# Patient Record
Sex: Female | Born: 1950
Health system: Southern US, Community
[De-identification: ages and names within clinical notes are randomized; demographics above are authoritative.]

## PROBLEM LIST (undated history)

## (undated) DIAGNOSIS — T7840XA Allergy, unspecified, initial encounter: Secondary | ICD-10-CM

## (undated) DIAGNOSIS — I10 Essential (primary) hypertension: Secondary | ICD-10-CM

## (undated) HISTORY — PX: ABDOMINAL HYSTERECTOMY: SHX81

## (undated) HISTORY — DX: Allergy, unspecified, initial encounter: T78.40XA

## (undated) HISTORY — DX: Essential (primary) hypertension: I10

---

## 2004-05-03 ENCOUNTER — Ambulatory Visit: Payer: Self-pay | Admitting: General Practice

## 2004-05-08 ENCOUNTER — Ambulatory Visit: Payer: Self-pay | Admitting: General Practice

## 2005-05-01 ENCOUNTER — Ambulatory Visit: Payer: Self-pay | Admitting: General Practice

## 2006-05-07 ENCOUNTER — Ambulatory Visit: Payer: Self-pay | Admitting: General Practice

## 2006-07-01 ENCOUNTER — Ambulatory Visit: Payer: Self-pay | Admitting: Unknown Physician Specialty

## 2006-12-03 ENCOUNTER — Ambulatory Visit: Payer: Self-pay | Admitting: Unknown Physician Specialty

## 2007-05-06 ENCOUNTER — Ambulatory Visit: Payer: Self-pay | Admitting: General Practice

## 2008-05-25 ENCOUNTER — Ambulatory Visit: Payer: Self-pay | Admitting: General Practice

## 2009-05-31 ENCOUNTER — Ambulatory Visit: Payer: Self-pay | Admitting: Unknown Physician Specialty

## 2010-06-07 ENCOUNTER — Ambulatory Visit: Payer: Self-pay | Admitting: Unknown Physician Specialty

## 2011-06-13 ENCOUNTER — Ambulatory Visit: Payer: Self-pay | Admitting: General Practice

## 2012-07-21 ENCOUNTER — Ambulatory Visit: Payer: Self-pay | Admitting: Family Medicine

## 2012-08-05 ENCOUNTER — Ambulatory Visit: Payer: Self-pay | Admitting: General Practice

## 2012-10-01 ENCOUNTER — Ambulatory Visit: Payer: Self-pay | Admitting: Family Medicine

## 2013-06-30 ENCOUNTER — Ambulatory Visit: Payer: Self-pay | Admitting: Family Medicine

## 2014-06-29 ENCOUNTER — Ambulatory Visit: Payer: Self-pay | Admitting: General Practice

## 2015-06-08 ENCOUNTER — Other Ambulatory Visit: Payer: Self-pay | Admitting: Family Medicine

## 2015-06-08 DIAGNOSIS — Z1231 Encounter for screening mammogram for malignant neoplasm of breast: Secondary | ICD-10-CM

## 2015-07-05 ENCOUNTER — Ambulatory Visit
Admission: RE | Admit: 2015-07-05 | Discharge: 2015-07-05 | Disposition: A | Discharge status: Home / Self Care | Payer: BLUE CROSS/BLUE SHIELD | Source: Ambulatory Visit | Attending: Family Medicine | Admitting: Family Medicine

## 2015-07-05 DIAGNOSIS — Z1231 Encounter for screening mammogram for malignant neoplasm of breast: Secondary | ICD-10-CM | POA: Insufficient documentation

## 2016-06-11 ENCOUNTER — Other Ambulatory Visit: Payer: Self-pay | Admitting: Family Medicine

## 2016-06-11 DIAGNOSIS — Z1231 Encounter for screening mammogram for malignant neoplasm of breast: Secondary | ICD-10-CM

## 2016-07-10 ENCOUNTER — Ambulatory Visit
Admission: RE | Admit: 2016-07-10 | Discharge: 2016-07-10 | Disposition: A | Discharge status: Home / Self Care | Payer: BLUE CROSS/BLUE SHIELD | Source: Ambulatory Visit | Attending: Family Medicine | Admitting: Family Medicine

## 2016-07-10 DIAGNOSIS — Z1231 Encounter for screening mammogram for malignant neoplasm of breast: Secondary | ICD-10-CM | POA: Insufficient documentation

## 2016-09-10 ENCOUNTER — Other Ambulatory Visit: Payer: Self-pay | Admitting: Family Medicine

## 2016-09-10 DIAGNOSIS — E28319 Asymptomatic premature menopause: Secondary | ICD-10-CM

## 2016-09-10 DIAGNOSIS — Z13820 Encounter for screening for osteoporosis: Secondary | ICD-10-CM

## 2016-09-20 ENCOUNTER — Ambulatory Visit
Admission: RE | Admit: 2016-09-20 | Discharge: 2016-09-20 | Disposition: A | Discharge status: Home / Self Care | Payer: BLUE CROSS/BLUE SHIELD | Source: Ambulatory Visit | Attending: Family Medicine | Admitting: Family Medicine

## 2016-09-20 DIAGNOSIS — Z13820 Encounter for screening for osteoporosis: Secondary | ICD-10-CM

## 2016-09-20 DIAGNOSIS — E28319 Asymptomatic premature menopause: Secondary | ICD-10-CM | POA: Diagnosis not present

## 2017-05-10 ENCOUNTER — Ambulatory Visit (INDEPENDENT_AMBULATORY_CARE_PROVIDER_SITE_OTHER): Payer: Medicare Other | Admitting: Physician Assistant

## 2017-05-10 ENCOUNTER — Encounter: Payer: Self-pay | Admitting: Physician Assistant

## 2017-05-10 ENCOUNTER — Other Ambulatory Visit: Payer: Self-pay

## 2017-05-10 VITALS — BP 140/90 | HR 85 | Temp 98.5°F | Resp 16 | Ht 72.0 in | Wt 224.6 lb

## 2017-05-10 DIAGNOSIS — I890 Lymphedema, not elsewhere classified: Secondary | ICD-10-CM

## 2017-05-10 DIAGNOSIS — Z7689 Persons encountering health services in other specified circumstances: Secondary | ICD-10-CM | POA: Diagnosis not present

## 2017-05-10 DIAGNOSIS — Z6830 Body mass index (BMI) 30.0-30.9, adult: Secondary | ICD-10-CM | POA: Diagnosis not present

## 2017-05-10 DIAGNOSIS — I10 Essential (primary) hypertension: Secondary | ICD-10-CM | POA: Diagnosis not present

## 2017-05-10 NOTE — Progress Notes (Signed)
Patient: Darlene Alvarez Female    DOB: Apr 14, 1950   67 y.o.   MRN: [id number] Visit Date: 05/10/2017  Today's Provider: Margaretann LovelessJennifer M Caydan Mctavish, PA-C   Chief Complaint  Patient presents with  . Establish Care   Subjective:    HPI Patient coming in today to establish care. She was previously a patient at [facility] with Dr. Dorothey BasemanStrickland and most recently Dr. Ellin Goodieabinowitz. She turned 67 yr old in December 2018 and they are no longer covered at the [place] after 66. She retired in 2010. She has had a colonoscopy in 2018 with Dr. Mechele CollinElliott. She gets regular mammograms, next due in 07/2017. She is up to date on vaccinations.   She reports she has history of hypertension and was treated up until 2010. She made lifestyle modifications and was able to discontinue medications. 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 He was diagnosed with stage 4 lung cancer and has been undergoing treatments. She reports he is doing well and the tumor has been responding to treatments. He is currently on immunotherapy only at this time, having completed current chemo and RXT. Since she has not been doing as well as she was with her dieting and exercise. She reports she has been snacking more due to stress. BP is slightly elevated today in the office.   She also has lymphedema of her legs bilaterally. She reports it is hereditary as it has been an issue since her teens and many women in her family have the same issue. She currently uses HCTZ 25mg  prn for when she is more "swollen". She does wear compression stockings and elevate her legs when at rest.      Allergies  Allergen Reactions  . Oxycodone-Acetaminophen Hives  . Sulfa Antibiotics Hives     Current Outpatient Medications:  .  hydrochlorothiazide (HYDRODIURIL) 25 MG tablet, Take by mouth., Disp: , Rfl:   Review of Systems  Constitutional: Negative.   HENT: Negative.   Eyes:  Negative.   Respiratory: Negative.   Cardiovascular: Positive for leg swelling.  Endocrine: Negative.   Genitourinary: Positive for vaginal discharge.  Musculoskeletal: Negative.   Skin: Positive for color change.  Allergic/Immunologic: Negative.   Neurological: Negative.   Hematological: Negative.   Psychiatric/Behavioral: Negative.     Social History   Tobacco Use  . Smoking status: Never Smoker  . Smokeless tobacco: Never Used  Substance Use Topics  . Alcohol use: No    Frequency: Never   Objective:   BP 140/90 (BP Location: Left Arm, Patient Position: Sitting, Cuff Size: Normal)   Pulse 85   Temp 98.5 F (36.9 C) (Oral)   Resp 16   Ht 6' (1.829 m)   Wt 224 lb 9.6 oz (101.9 kg)   BMI 30.46 kg/m    Physical Exam  Constitutional: She is oriented to person, place, and time. She appears well-developed and well-nourished. No distress.  HENT:  Head: Normocephalic and atraumatic.  Right Ear: Hearing, tympanic membrane, external ear and ear canal normal.  Left Ear: Hearing, tympanic membrane, external ear and ear canal normal.  Nose: Nose normal.  Mouth/Throat: Oropharynx is clear and moist. No oropharyngeal exudate.  Eyes: Conjunctivae and EOM are normal. Pupils are equal, round, and reactive to light. Right eye exhibits no discharge. Left eye exhibits no discharge. No scleral icterus.  Neck: Normal range of motion. Neck supple. No JVD present. No tracheal  deviation present. No thyromegaly present.  Cardiovascular: Normal rate, regular rhythm, normal heart sounds and intact distal pulses. Exam reveals no gallop and no friction rub.  No murmur heard. Pulmonary/Chest: Effort normal and breath sounds normal. No respiratory distress. She has no wheezes. She has no rales. She exhibits no tenderness.  Abdominal: Soft. Bowel sounds are normal. She exhibits no distension and no mass. There is no tenderness. There is no rebound and no guarding.  Musculoskeletal: Normal range of  motion. She exhibits no edema or tenderness.  Lymphadenopathy:    She has no cervical adenopathy.  Neurological: She is alert and oriented to person, place, and time.  Skin: Skin is warm and dry. No rash noted. She is not diaphoretic.  Psychiatric: She has a normal mood and affect. Her behavior is normal. Judgment and thought content normal.  Vitals reviewed.       Assessment & Plan:     1. Establishing care with new doctor, encounter for Establishing from the Occupational Health office at [facility].   2. Essential hypertension Slight elevation today. I will see her back at the end of March/early April for her initial welcome to medicare visit. If still elevated will discuss different options at that time.  3. Lymphedema Stable with conservative measures currently of compression stockings, elevating, and using HCTZ 25mg  prn.  4. BMI 30.0-30.9,adult Counseled patient on healthy lifestyle modifications including dieting and exercise.        Margaretann Loveless, PA-C  [facility]

## 2017-05-12 ENCOUNTER — Encounter: Payer: Self-pay | Admitting: Physician Assistant

## 2017-05-12 DIAGNOSIS — Z6830 Body mass index (BMI) 30.0-30.9, adult: Secondary | ICD-10-CM | POA: Insufficient documentation

## 2017-05-12 DIAGNOSIS — I890 Lymphedema, not elsewhere classified: Secondary | ICD-10-CM | POA: Insufficient documentation

## 2017-05-12 DIAGNOSIS — I10 Essential (primary) hypertension: Secondary | ICD-10-CM | POA: Insufficient documentation

## 2017-05-30 ENCOUNTER — Telehealth: Payer: Self-pay

## 2017-05-30 DIAGNOSIS — I10 Essential (primary) hypertension: Secondary | ICD-10-CM

## 2017-05-30 MED ORDER — AMLODIPINE BESYLATE 5 MG PO TABS: 5.0000 mg | ORAL_TABLET | Freq: Every day | ORAL | 1 refills | Status: DC

## 2017-05-30 NOTE — Telephone Encounter (Signed)
Patient is calling that her BP reading this morning was195/105. Reports that last night she took her BP just to check and it was 187/95. She reports that the only medicine that she takes is the Hydrochlorothiazide 25mg . Reports that it has been like a month or two that she has not been taking it until today.She does have a slight headache but feels is from the medication because it makes her feel like this every time she takes. She denies light headedness, SOB, visual disturbances, chest pain, palpitations. Reports that she feels fine. Please Advise.  No more appointments available with you.

## 2017-05-30 NOTE — Telephone Encounter (Signed)
Advised patient as below.  

## 2017-05-30 NOTE — Telephone Encounter (Signed)
Patient reports that she just had her BP rechecked and it is 189/105. I advised her what Antony ContrasJenni recommended below, and she reports that she will start taking the HCTZ more consistently. Could you also send in the additional BP [facility] [place]? The patient's only symptom currently is a headache. She denies chest pain, shortness of breath, dizziness, numbness and tingling in her extremities, and upper back pain.

## 2017-05-30 NOTE — Telephone Encounter (Signed)
Have her check BP in 1-2 hours after taking medication. She needs to take medication every day to keep better control over BP. If it is still elevated this afternoon I will send in a second pill for her to take. If she has side effects as to why she does not take HCTZ daily I can change her medication as well.

## 2017-05-30 NOTE — Telephone Encounter (Signed)
[facility] Call with BP reading tomorrow. Go to ER if symptoms change or worsen.

## 2017-05-30 NOTE — Telephone Encounter (Signed)
[id number] 

## 2017-06-07 ENCOUNTER — Telehealth: Payer: Self-pay

## 2017-06-07 NOTE — Telephone Encounter (Signed)
Left Messages that we already have Darlene Alvarez's scheduled and to schedule her Initial medicare wellness like they had discuss. Around the middle of March from the 15th and on.  Thanks,  -Alashia Brownfield

## 2017-06-14 NOTE — Telephone Encounter (Signed)
Pt scheduled on 03/28

## 2017-06-24 ENCOUNTER — Other Ambulatory Visit: Payer: Self-pay | Admitting: Physician Assistant

## 2017-06-24 DIAGNOSIS — I10 Essential (primary) hypertension: Secondary | ICD-10-CM

## 2017-06-24 MED ORDER — AMLODIPINE BESYLATE 5 MG PO TABS: 5.0000 mg | ORAL_TABLET | Freq: Every day | ORAL | 1 refills | Status: DC

## 2017-06-24 NOTE — Telephone Encounter (Signed)
Please review. Thanks!  

## 2017-06-24 NOTE — Telephone Encounter (Signed)
[facility] for a 90-days supply for the following medication. Thanks CC ° °amLODipine (NORVASC) 5 MG tablet  ° °

## 2017-07-04 ENCOUNTER — Ambulatory Visit (INDEPENDENT_AMBULATORY_CARE_PROVIDER_SITE_OTHER): Payer: Medicare Other | Admitting: Physician Assistant

## 2017-07-04 ENCOUNTER — Encounter: Payer: Self-pay | Admitting: Physician Assistant

## 2017-07-04 VITALS — BP 130/80 | HR 74 | Temp 98.2°F | Resp 16 | Ht 72.0 in | Wt 227.0 lb

## 2017-07-04 DIAGNOSIS — Z1239 Encounter for other screening for malignant neoplasm of breast: Secondary | ICD-10-CM

## 2017-07-04 DIAGNOSIS — Z0000 Encounter for general adult medical examination without abnormal findings: Secondary | ICD-10-CM

## 2017-07-04 DIAGNOSIS — Z1211 Encounter for screening for malignant neoplasm of colon: Secondary | ICD-10-CM | POA: Diagnosis not present

## 2017-07-04 DIAGNOSIS — Z1272 Encounter for screening for malignant neoplasm of vagina: Secondary | ICD-10-CM | POA: Diagnosis not present

## 2017-07-04 DIAGNOSIS — Z23 Encounter for immunization: Secondary | ICD-10-CM | POA: Diagnosis not present

## 2017-07-04 DIAGNOSIS — Z1159 Encounter for screening for other viral diseases: Secondary | ICD-10-CM

## 2017-07-04 DIAGNOSIS — I890 Lymphedema, not elsewhere classified: Secondary | ICD-10-CM

## 2017-07-04 DIAGNOSIS — Z114 Encounter for screening for human immunodeficiency virus [HIV]: Secondary | ICD-10-CM | POA: Diagnosis not present

## 2017-07-04 DIAGNOSIS — Z1231 Encounter for screening mammogram for malignant neoplasm of breast: Secondary | ICD-10-CM | POA: Diagnosis not present

## 2017-07-04 DIAGNOSIS — I10 Essential (primary) hypertension: Secondary | ICD-10-CM

## 2017-07-04 DIAGNOSIS — Z6830 Body mass index (BMI) 30.0-30.9, adult: Secondary | ICD-10-CM

## 2017-07-04 NOTE — Progress Notes (Signed)
Patient: Darlene Alvarez, Female    DOB: 12-28-1950, 67 y.o.   MRN: [id number] Visit Date: 07/04/2017  Today's Provider: Margaretann Loveless, PA-C   Chief Complaint  Patient presents with  . Medicare Wellness   Subjective:    Annual wellness visit Darlene Alvarez is a 67 y.o. female. She feels well. She reports exercising walking some. She reports she is sleeping well.  07/10/16 Mammogram-BI-RADS 1 09/20/16 BMD-Normal -----------------------------------------------------------   Review of Systems  Constitutional: Negative.   HENT: Positive for congestion.   Eyes: Negative.   Respiratory: Negative.   Cardiovascular: Positive for leg swelling (chronic -lymphedema).  Gastrointestinal: Negative.   Endocrine: Negative.   Genitourinary: Negative.   Musculoskeletal: Negative.   Skin: Negative.   Allergic/Immunologic: Negative.   Neurological: Negative.   Hematological: Negative.   Psychiatric/Behavioral: Negative.     Social History   Socioeconomic History  . Marital status: Married    Spouse name: Not on file  . Number of children: Not on file  . Years of education: Not on file  . Highest education level: Not on file  Occupational History  . Occupation: social service    Comment: Programmer, applications.  Social Needs  . Financial resource strain: Not on file  . Food insecurity:    Worry: Not on file    Inability: Not on file  . Transportation needs:    Medical: Not on file    Non-medical: Not on file  Tobacco Use  . Smoking status: Never Smoker  . Smokeless tobacco: Never Used  Substance and Sexual Activity  . Alcohol use: No    Frequency: Never  . Drug use: No  . Sexual activity: Not on file  Lifestyle  . Physical activity:    Days per week: Not on file    Minutes per session: Not on file  . Stress: Not on file  Relationships  . Social connections:    Talks on phone: Not on file    Gets together: Not on file    Attends religious  service: Not on file    Active member of club or organization: Not on file    Attends meetings of clubs or organizations: Not on file    Relationship status: Not on file  . Intimate partner violence:    Fear of current or ex partner: Not on file    Emotionally abused: Not on file    Physically abused: Not on file    Forced sexual activity: Not on file  Other Topics Concern  . Not on file  Social History Narrative  . Not on file    Past Medical History:  Diagnosis Date  . Allergy      Patient Active Problem List   Diagnosis Date Noted  . Essential hypertension 05/12/2017  . Lymphedema 05/12/2017  . BMI 30.0-30.9,adult 05/12/2017    Past Surgical History:  Procedure Laterality Date  . ABDOMINAL HYSTERECTOMY    . CESAREAN SECTION      Her family history includes Alcohol abuse in her mother; Cancer in her father; Diabetes in her brother, mother, other, sister, and sister; Healthy in her daughter; Hypertension in her father; Lung disease in her father; Stroke in her other; Thyroid disease in her sister and sister.      Current Outpatient Medications:  .  amLODipine (NORVASC) 5 MG tablet, Take 1 tablet (5 mg total) by mouth daily., Disp: 90 tablet, Rfl: 1 .  hydrochlorothiazide (HYDRODIURIL) 25 MG  tablet, Take by mouth., Disp: , Rfl:   Patient Care Team: Margaretann LovelessBurnette, Diane Mochizuki M, PA-C as PCP - General (Family Medicine)     Objective:   Vitals: BP 130/80 (BP Location: Left Arm, Patient Position: Sitting, Cuff Size: Large)   Pulse 74   Temp 98.2 F (36.8 C) (Oral)   Resp 16   Ht 6' (1.829 m)   Wt 227 lb (103 kg)   SpO2 96%   BMI 30.79 kg/m   Physical Exam  Constitutional: She is oriented to person, place, and time. She appears well-developed and well-nourished. No distress.  HENT:  Head: Normocephalic and atraumatic.  Right Ear: Hearing, tympanic membrane, external ear and ear canal normal.  Left Ear: Hearing, tympanic membrane, external ear and ear canal normal.    Nose: Nose normal.  Mouth/Throat: Uvula is midline, oropharynx is clear and moist and mucous membranes are normal. No oropharyngeal exudate.  Eyes: Pupils are equal, round, and reactive to light. Conjunctivae and EOM are normal. Right eye exhibits no discharge. Left eye exhibits no discharge. No scleral icterus.  Neck: Normal range of motion. Neck supple. No JVD present. Carotid bruit is not present. No tracheal deviation present. No thyromegaly present.  Cardiovascular: Normal rate, regular rhythm, normal heart sounds and intact distal pulses. Exam reveals no gallop and no friction rub.  No murmur heard. Pulmonary/Chest: Effort normal and breath sounds normal. No respiratory distress. She has no wheezes. She has no rales. She exhibits no tenderness. Right breast exhibits no inverted nipple, no mass, no nipple discharge, no skin change and no tenderness. Left breast exhibits no inverted nipple, no mass, no nipple discharge, no skin change and no tenderness. No breast swelling, tenderness, discharge or bleeding. Breasts are symmetrical.  Abdominal: Soft. Bowel sounds are normal. She exhibits no distension and no mass. There is no tenderness. There is no rebound and no guarding. Hernia confirmed negative in the right inguinal area and confirmed negative in the left inguinal area.  Genitourinary: Rectum normal and vagina normal. No breast swelling, tenderness, discharge or bleeding. Pelvic exam was performed with patient supine. There is no rash, tenderness, lesion or injury on the right labia. There is no rash, tenderness, lesion or injury on the left labia. Right adnexum displays no mass, no tenderness and no fullness. Left adnexum displays no mass, no tenderness and no fullness. No erythema, tenderness or bleeding in the vagina. No signs of injury around the vagina. No vaginal discharge found.  Genitourinary Comments: Uterus and cervix surgically absent  Musculoskeletal: Normal range of motion. She  exhibits no edema or tenderness.  Lymphadenopathy:    She has no cervical adenopathy.       Right: No inguinal adenopathy present.       Left: No inguinal adenopathy present.  Neurological: She is alert and oriented to person, place, and time. She has normal reflexes. No cranial nerve deficit. Coordination normal.  Skin: Skin is warm and dry. No rash noted. She is not diaphoretic.  Psychiatric: She has a normal mood and affect. Her behavior is normal. Judgment and thought content normal.  Vitals reviewed.   Activities of Daily Living In your present state of health, do you have any difficulty performing the following activities: 07/04/2017 05/10/2017  Hearing? N N  Vision? N N  Difficulty concentrating or making decisions? N N  Walking or climbing stairs? N N  Dressing or bathing? N N  Doing errands, shopping? N N  Some recent data might be hidden  Fall Risk Assessment Fall Risk  07/04/2017 05/10/2017  Falls in the past year? No No     Depression Screen PHQ 2/9 Scores 07/04/2017 05/10/2017  PHQ - 2 Score 0 0  PHQ- 9 Score 0 -    Cognitive Testing - 6-CIT  Correct? Score   What year is it? yes 0 0 or 4  What month is it? yes 0 0 or 3  Memorize:    Floyde Parkins,  43,  [address]  [place],      What time is it? (within 1 hour) yes 0 0 or 3  Count backwards from 20 yes 0 0, 2, or 4  Name the months of the year yes 0 0, 2, or 4  Repeat name & address above no 2 0, 2, 4, 6, 8, or 10       TOTAL SCORE  2/28   Interpretation:  Normal  Normal (0-7) Abnormal (8-28)     Assessment & Plan:     Annual Wellness Visit  Reviewed patient's Family Medical History Reviewed and updated list of patient's medical providers Assessment of cognitive impairment was done Assessed patient's functional ability Established a written schedule for health screening services Health Risk Assessent Completed and Reviewed  Exercise Activities and Dietary recommendations Goals    None        There is no immunization history on file for this patient.  Health Maintenance  Topic Date Due  . Hepatitis C Screening  06/18/1950  . HIV Screening  04/05/1967  . TETANUS/TDAP  04/05/1971  . PAP SMEAR  04/04/1973  . COLONOSCOPY  04/04/2002  . PNA vac Low Risk Adult (1 of 2 - PCV13) 04/04/2017  . INFLUENZA VACCINE  07/07/2017 (Originally 11/07/2016)  . MAMMOGRAM  07/11/2018  . DEXA SCAN  Completed     Discussed health benefits of physical activity, and encouraged her to engage in regular exercise appropriate for her age and condition.    1. Welcome to Medicare preventive visit EKG shows NSR with left atrial  - EKG 12-Lead  2. Breast cancer screening Breast exam today was normal. There is no family history of breast cancer. She does perform regular self breast exams. Mammogram was ordered as below. Information for [facility] was given to patient so she may schedule her mammogram at her convenience. - MM Digital Screening; Future  3. Screening for vaginal cancer Pap collected today. Will send as below and f/u pending results. - Pap IG and HPV (high risk) DNA detection  4. Colon cancer screening Reports done last year. Records requested.   5. Essential hypertension Stable. Continue HCTZ 25mg  and amlodipine 5mg . Will check labs as below and f/u pending results. - CBC w/Diff/Platelet - Comprehensive Metabolic Panel (CMET) - TSH - Lipid Profile  6. BMI 30.0-30.9,adult Counseled patient on healthy lifestyle modifications including dieting and exercise.  - CBC w/Diff/Platelet - Comprehensive Metabolic Panel (CMET) - TSH - Lipid Profile  7. Lymphedema Stable.  - Comprehensive Metabolic Panel (CMET) - TSH - Lipid Profile  8. Need for hepatitis C screening test - Hepatitis C Antibody  9. Screening for HIV without presence of risk factors - HIV antibody (with reflex)  10. Need for pneumococcal vaccination Patient declines.    ------------------------------------------------------------------------------------------------------------    Margaretann Loveless, PA-C  [facility]

## 2017-07-04 NOTE — Patient Instructions (Signed)
Health Maintenance for Postmenopausal Women Menopause is a normal process in which your reproductive ability comes to an end. This process happens gradually over a span of months to years, usually between the ages of 72 and 45. Menopause is complete when you have missed 12 consecutive menstrual periods. It is important to talk with your health care provider about some of the most common conditions that affect postmenopausal women, such as heart disease, cancer, and bone loss (osteoporosis). Adopting a healthy lifestyle and getting preventive care can help to promote your health and wellness. Those actions can also lower your chances of developing some of these common conditions. What should I know about menopause? During menopause, you may experience a number of symptoms, such as:  Moderate-to-severe hot flashes.  Night sweats.  Decrease in sex drive.  Mood swings.  Headaches.  Tiredness.  Irritability.  Memory problems.  Insomnia.  Choosing to treat or not to treat menopausal changes is an individual decision that you make with your health care provider. What should I know about hormone replacement therapy and supplements? Hormone therapy products are effective for treating symptoms that are associated with menopause, such as hot flashes and night sweats. Hormone replacement carries certain risks, especially as you become older. If you are thinking about using estrogen or estrogen with progestin treatments, discuss the benefits and risks with your health care provider. What should I know about heart disease and stroke? Heart disease, heart attack, and stroke become more likely as you age. This may be due, in part, to the hormonal changes that your body experiences during menopause. These can affect how your body processes dietary fats, triglycerides, and cholesterol. Heart attack and stroke are both medical emergencies. There are many things that you can do to help prevent heart disease  and stroke:  Have your blood pressure checked at least every 1-2 years. High blood pressure causes heart disease and increases the risk of stroke.  If you are 76-17 years old, ask your health care provider if you should take aspirin to prevent a heart attack or a stroke.  Do not use any tobacco products, including cigarettes, chewing tobacco, or electronic cigarettes. If you need help quitting, ask your health care provider.  It is important to eat a healthy diet and maintain a healthy weight. ? Be sure to include plenty of vegetables, fruits, low-fat dairy products, and lean protein. ? Avoid eating foods that are high in solid fats, added sugars, or salt (sodium).  Get regular exercise. This is one of the most important things that you can do for your health. ? Try to exercise for at least 150 minutes each week. The type of exercise that you do should increase your heart rate and make you sweat. This is known as moderate-intensity exercise. ? Try to do strengthening exercises at least twice each week. Do these in addition to the moderate-intensity exercise.  Know your numbers.Ask your health care provider to check your cholesterol and your blood glucose. Continue to have your blood tested as directed by your health care provider.  What should I know about cancer screening? There are several types of cancer. Take the following steps to reduce your risk and to catch any cancer development as early as possible. Breast Cancer  Practice breast self-awareness. ? This means understanding how your breasts normally appear and feel. ? It also means doing regular breast self-exams. Let your health care provider know about any changes, no matter how small.  If you are 40  or older, have a clinician do a breast exam (clinical breast exam or CBE) every year. Depending on your age, family history, and medical history, it may be recommended that you also have a yearly breast X-ray (mammogram).  If you  have a family history of breast cancer, talk with your health care provider about genetic screening.  If you are at high risk for breast cancer, talk with your health care provider about having an MRI and a mammogram every year.  Breast cancer (BRCA) gene test is recommended for women who have family members with BRCA-related cancers. Results of the assessment will determine the need for genetic counseling and BRCA1 and for BRCA2 testing. BRCA-related cancers include these types: ? Breast. This occurs in males or females. ? Ovarian. ? Tubal. This may also be called fallopian tube cancer. ? Cancer of the abdominal or pelvic lining (peritoneal cancer). ? Prostate. ? Pancreatic.  Cervical, Uterine, and Ovarian Cancer Your health care provider may recommend that you be screened regularly for cancer of the pelvic organs. These include your ovaries, uterus, and vagina. This screening involves a pelvic exam, which includes checking for microscopic changes to the surface of your cervix (Pap test).  For women ages 21-65, health care providers may recommend a pelvic exam and a Pap test every three years. For women ages 4-65, they may recommend the Pap test and pelvic exam, combined with testing for human papilloma virus (HPV), every five years. Some types of HPV increase your risk of cervical cancer. Testing for HPV may also be done on women of any age who have unclear Pap test results.  Other health care providers may not recommend any screening for nonpregnant women who are considered low risk for pelvic cancer and have no symptoms. Ask your health care provider if a screening pelvic exam is right for you.  If you have had past treatment for cervical cancer or a condition that could lead to cancer, you need Pap tests and screening for cancer for at least 20 years after your treatment. If Pap tests have been discontinued for you, your risk factors (such as having a new sexual partner) need to be  reassessed to determine if you should start having screenings again. Some women have medical problems that increase the chance of getting cervical cancer. In these cases, your health care provider may recommend that you have screening and Pap tests more often.  If you have a family history of uterine cancer or ovarian cancer, talk with your health care provider about genetic screening.  If you have vaginal bleeding after reaching menopause, tell your health care provider.  There are currently no reliable tests available to screen for ovarian cancer.  Lung Cancer Lung cancer screening is recommended for adults 76-84 years old who are at high risk for lung cancer because of a history of smoking. A yearly low-dose CT scan of the lungs is recommended if you:  Currently smoke.  Have a history of at least 30 pack-years of smoking and you currently smoke or have quit within the past 15 years. A pack-year is smoking an average of one pack of cigarettes per day for one year.  Yearly screening should:  Continue until it has been 15 years since you quit.  Stop if you develop a health problem that would prevent you from having lung cancer treatment.  Colorectal Cancer  This type of cancer can be detected and can often be prevented.  Routine colorectal cancer screening usually begins at  age 89 and continues through age 55.  If you have risk factors for colon cancer, your health care provider may recommend that you be screened at an earlier age.  If you have a family history of colorectal cancer, talk with your health care provider about genetic screening.  Your health care provider may also recommend using home test kits to check for hidden blood in your stool.  A small camera at the end of a tube can be used to examine your colon directly (sigmoidoscopy or colonoscopy). This is done to check for the earliest forms of colorectal cancer.  Direct examination of the colon should be repeated every  5-10 years until age 71. However, if early forms of precancerous polyps or small growths are found or if you have a family history or genetic risk for colorectal cancer, you may need to be screened more often.  Skin Cancer  Check your skin from head to toe regularly.  Monitor any moles. Be sure to tell your health care provider: ? About any new moles or changes in moles, especially if there is a change in a mole's shape or color. ? If you have a mole that is larger than the size of a pencil eraser.  If any of your family members has a history of skin cancer, especially at a young age, talk with your health care provider about genetic screening.  Always use sunscreen. Apply sunscreen liberally and repeatedly throughout the day.  Whenever you are outside, protect yourself by wearing long sleeves, pants, a wide-brimmed hat, and sunglasses.  What should I know about osteoporosis? Osteoporosis is a condition in which bone destruction happens more quickly than new bone creation. After menopause, you may be at an increased risk for osteoporosis. To help prevent osteoporosis or the bone fractures that can happen because of osteoporosis, the following is recommended:  If you are 71-3 years old, get at least 1,000 mg of calcium and at least 600 mg of vitamin D per day.  If you are older than age 30 but younger than age 22, get at least 1,200 mg of calcium and at least 600 mg of vitamin D per day.  If you are older than age 66, get at least 1,200 mg of calcium and at least 800 mg of vitamin D per day.  Smoking and excessive alcohol intake increase the risk of osteoporosis. Eat foods that are rich in calcium and vitamin D, and do weight-bearing exercises several times each week as directed by your health care provider. What should I know about how menopause affects my mental health? Depression may occur at any age, but it is more common as you become older. Common symptoms of depression  include:  Low or sad mood.  Changes in sleep patterns.  Changes in appetite or eating patterns.  Feeling an overall lack of motivation or enjoyment of activities that you previously enjoyed.  Frequent crying spells.  Talk with your health care provider if you think that you are experiencing depression. What should I know about immunizations? It is important that you get and maintain your immunizations. These include:  Tetanus, diphtheria, and pertussis (Tdap) booster vaccine.  Influenza every year before the flu season begins.  Pneumonia vaccine.  Shingles vaccine.  Your health care provider may also recommend other immunizations. This information is not intended to replace advice given to you by your health care provider. Make sure you discuss any questions you have with your health care provider. Document Released: 05/18/2005  Document Revised: 10/14/2015 Document Reviewed: 12/28/2014 Elsevier Interactive Patient Education  2018 Elsevier Inc[employer]  

## 2017-07-05 ENCOUNTER — Telehealth: Payer: Self-pay

## 2017-07-05 LAB — COMPREHENSIVE METABOLIC PANEL
ALT: 13 IU/L (ref 0–32)
AST: 14 IU/L (ref 0–40)
Albumin/Globulin Ratio: 1.1 — ABNORMAL LOW (ref 1.2–2.2)
Albumin: 4.1 g/dL (ref 3.6–4.8)
Alkaline Phosphatase: 125 IU/L — ABNORMAL HIGH (ref 39–117)
BUN/Creatinine Ratio: 15 (ref 12–28)
BUN: 12 mg/dL (ref 8–27)
Bilirubin Total: 0.4 mg/dL (ref 0.0–1.2)
CALCIUM: 9.4 mg/dL (ref 8.7–10.3)
CO2: 24 mmol/L (ref 20–29)
Chloride: 97 mmol/L (ref 96–106)
Creatinine, Ser: 0.8 mg/dL (ref 0.57–1.00)
GFR, EST AFRICAN AMERICAN: 89 mL/min/{1.73_m2} (ref >59)
GFR, EST NON AFRICAN AMERICAN: 78 mL/min/{1.73_m2} (ref >59)
Globulin, Total: 3.7 g/dL (ref 1.5–4.5)
Glucose: 89 mg/dL (ref 65–99)
Potassium: 3.6 mmol/L (ref 3.5–5.2)
Sodium: 138 mmol/L (ref 134–144)
TOTAL PROTEIN: 7.8 g/dL (ref 6.0–8.5)

## 2017-07-05 LAB — CBC WITH DIFFERENTIAL/PLATELET
BASOS: 0 %
Basophils Absolute: 0 10*3/uL (ref 0.0–0.2)
EOS (ABSOLUTE): 0.2 10*3/uL (ref 0.0–0.4)
EOS: 5 %
HEMATOCRIT: 36.7 % (ref 34.0–46.6)
Hemoglobin: 12.3 g/dL (ref 11.1–15.9)
IMMATURE GRANS (ABS): 0 10*3/uL (ref 0.0–0.1)
IMMATURE GRANULOCYTES: 0 %
LYMPHS: 40 %
Lymphocytes Absolute: 1.6 10*3/uL (ref 0.7–3.1)
MCH: 28.5 pg (ref 26.6–33.0)
MCHC: 33.5 g/dL (ref 31.5–35.7)
MCV: 85 fL (ref 79–97)
Monocytes Absolute: 0.3 10*3/uL (ref 0.1–0.9)
Monocytes: 6 %
Neutrophils Absolute: 2 10*3/uL (ref 1.4–7.0)
Neutrophils: 49 %
Platelets: 241 10*3/uL (ref 150–379)
RBC: 4.32 x10E6/uL (ref 3.77–5.28)
RDW: 13.2 % (ref 12.3–15.4)
WBC: 4.1 10*3/uL (ref 3.4–10.8)

## 2017-07-05 LAB — LIPID PANEL
CHOL/HDL RATIO: 2.6 ratio (ref 0.0–4.4)
Cholesterol, Total: 168 mg/dL (ref 100–199)
HDL: 64 mg/dL (ref >39)
LDL CALC: 90 mg/dL (ref 0–99)
TRIGLYCERIDES: 69 mg/dL (ref 0–149)
VLDL CHOLESTEROL CAL: 14 mg/dL (ref 5–40)

## 2017-07-05 LAB — HEPATITIS C ANTIBODY: Hep C Virus Ab: <0.1 s/co ratio (ref 0.0–0.9)

## 2017-07-05 LAB — TSH: TSH: 1.98 u[IU]/mL (ref 0.450–4.500)

## 2017-07-05 LAB — HIV ANTIBODY (ROUTINE TESTING W REFLEX): HIV Screen 4th Generation wRfx: NONREACTIVE

## 2017-07-05 NOTE — Telephone Encounter (Signed)
-----   Message from Margaretann LovelessJennifer M Burnette, PA-C sent at 07/05/2017  8:33 AM EDT ----- All labs are within normal limits and stable.  Thanks! -JB

## 2017-07-05 NOTE — Telephone Encounter (Signed)
Patient advised as below.  

## 2017-07-06 LAB — PAP IG AND HPV HIGH-RISK
HPV, HIGH-RISK: NEGATIVE
PAP Smear Comment: 0

## 2017-07-08 ENCOUNTER — Telehealth: Payer: Self-pay

## 2017-07-08 NOTE — Telephone Encounter (Signed)
-----   Message from Margaretann LovelessJennifer M Burnette, PA-C sent at 07/08/2017  1:24 PM EDT ----- Pap is normal, HPV negative.

## 2017-07-08 NOTE — Telephone Encounter (Signed)
Patient advised as directed below.  Thanks,  -Wynetta Seith 

## 2017-07-25 ENCOUNTER — Ambulatory Visit
Admission: RE | Admit: 2017-07-25 | Discharge: 2017-07-25 | Disposition: A | Discharge status: Home / Self Care | Payer: Medicare Other | Source: Ambulatory Visit | Attending: Physician Assistant | Admitting: Physician Assistant

## 2017-07-25 DIAGNOSIS — Z1239 Encounter for other screening for malignant neoplasm of breast: Secondary | ICD-10-CM

## 2017-07-25 DIAGNOSIS — Z1231 Encounter for screening mammogram for malignant neoplasm of breast: Secondary | ICD-10-CM | POA: Diagnosis not present

## 2017-07-29 ENCOUNTER — Telehealth: Payer: Self-pay

## 2017-07-29 NOTE — Telephone Encounter (Signed)
-----   Message from Margaretann LovelessJennifer M Burnette, PA-C sent at 07/26/2017  8:28 AM EDT ----- Normal mammogram. Repeat screening in one year.

## 2017-07-29 NOTE — Telephone Encounter (Signed)
Left message to call back  

## 2017-07-31 NOTE — Telephone Encounter (Signed)
Patient advised.

## 2017-09-27 ENCOUNTER — Encounter: Payer: Self-pay | Admitting: Physician Assistant

## 2017-09-27 ENCOUNTER — Ambulatory Visit (INDEPENDENT_AMBULATORY_CARE_PROVIDER_SITE_OTHER): Payer: Medicare Other | Admitting: Physician Assistant

## 2017-09-27 VITALS — BP 128/84 | HR 88 | Temp 97.7°F | Resp 16 | Wt 225.0 lb

## 2017-09-27 DIAGNOSIS — R21 Rash and other nonspecific skin eruption: Secondary | ICD-10-CM | POA: Diagnosis not present

## 2017-09-27 MED ORDER — PREDNISONE 10 MG (21) PO TBPK: ORAL_TABLET | ORAL | 0 refills | Status: DC

## 2017-09-27 NOTE — Progress Notes (Signed)
     Patient: Darlene Alvarez Female    DOB: 1950-05-13   67 y.o.   MRN: [id number] Visit Date: 09/27/2017  Today's Provider: Margaretann LovelessJennifer M Burnette, PA-C   Chief Complaint  Patient presents with  . Rash    On her arm and leg.    Subjective:    Rash  This is a new problem. The current episode started yesterday. The problem has been gradually worsening since onset. The affected locations include the right arm and left lower leg. The rash is characterized by itchiness, swelling and redness. She was exposed to nothing. Pertinent negatives include no anorexia, congestion, cough, diarrhea, eye pain, facial edema, fatigue, fever, joint pain, nail changes, rhinorrhea, shortness of breath, sore throat or vomiting. Past treatments include antihistamine. The treatment provided mild relief.  States it feels as if "something is in her blood biting me from the inside."    Allergies  Allergen Reactions  . Oxycodone-Acetaminophen Hives  . Sulfa Antibiotics Hives     Current Outpatient Medications:  .  amLODipine (NORVASC) 5 MG tablet, Take 1 tablet (5 mg total) by mouth daily., Disp: 90 tablet, Rfl: 1 .  hydrochlorothiazide (HYDRODIURIL) 25 MG tablet, Take by mouth., Disp: , Rfl:   Review of Systems  Constitutional: Negative.  Negative for fatigue and fever.  HENT: Negative for congestion, rhinorrhea and sore throat.   Eyes: Negative for pain.  Respiratory: Negative.  Negative for cough and shortness of breath.   Gastrointestinal: Negative for anorexia, diarrhea and vomiting.  Musculoskeletal: Negative for joint pain.  Skin: Positive for rash. Negative for color change, nail changes, pallor and wound.  Neurological: Negative for dizziness, light-headedness and headaches.    Social History   Tobacco Use  . Smoking status: Never Smoker  . Smokeless tobacco: Never Used  Substance Use Topics  . Alcohol use: No    Frequency: Never   Objective:   BP 128/84 (BP Location: Left Arm,  Patient Position: Sitting, Cuff Size: Large)   Pulse 88   Temp 97.7 F (36.5 C) (Oral)   Resp 16   Wt 225 lb (102.1 kg)   BMI 30.52 kg/m    Physical Exam  Constitutional: She appears well-developed and well-nourished. No distress.  Neck: Normal range of motion. Neck supple.  Cardiovascular: Normal rate, regular rhythm and normal heart sounds. Exam reveals no gallop and no friction rub.  No murmur heard. Pulmonary/Chest: Effort normal and breath sounds normal. No respiratory distress. She has no wheezes. She has no rales.  Skin: She is not diaphoretic.  Urticarial type rash, see below images  Vitals reviewed.    Left lower leg  Right Forearm     Assessment & Plan:     1. Rash Unsure of cause. Will treat with prednisone taper as below. Will check autoimmune source as patient is worried of Lupus. Had similar rash occur around same time last year. She is to call if no improvements and I will call pending lab results.  - predniSONE (STERAPRED UNI-PAK 21 TAB) 10 MG (21) TBPK tablet; 6 day taper; take as directed on package instructions  Dispense: 21 tablet; Refill: 0 - ANA,IFA RA Diag Pnl w/rflx Tit/Patn       Margaretann LovelessJennifer M Burnette, PA-C  [facility]

## 2017-09-30 ENCOUNTER — Telehealth: Payer: Self-pay

## 2017-09-30 LAB — ANA,IFA RA DIAG PNL W/RFLX TIT/PATN
ANA TITER 1: NEGATIVE
Cyclic Citrullin Peptide Ab: 7 units (ref 0–19)
RHEUMATOID FACTOR: 10 [IU]/mL (ref 0.0–13.9)

## 2017-09-30 NOTE — Telephone Encounter (Signed)
-----   Message from Margaretann LovelessJennifer M Burnette, [place]PA-C sent at 09/30/2017  2:28 PM EDT ----- Autoimmune panel is negative.

## 2017-09-30 NOTE — Telephone Encounter (Signed)
Patient advised as directed below.  Thanks,  -Bo Teicher 

## 2017-12-21 ENCOUNTER — Other Ambulatory Visit: Payer: Self-pay | Admitting: Physician Assistant

## 2017-12-21 DIAGNOSIS — I10 Essential (primary) hypertension: Secondary | ICD-10-CM

## 2018-03-28 ENCOUNTER — Ambulatory Visit (INDEPENDENT_AMBULATORY_CARE_PROVIDER_SITE_OTHER): Payer: Medicare Other | Admitting: Physician Assistant

## 2018-03-28 ENCOUNTER — Encounter: Payer: Self-pay | Admitting: Physician Assistant

## 2018-03-28 VITALS — BP 161/77 | HR 76 | Temp 98.1°F | Resp 16 | Wt 234.0 lb

## 2018-03-28 DIAGNOSIS — H66002 Acute suppurative otitis media without spontaneous rupture of ear drum, left ear: Secondary | ICD-10-CM | POA: Diagnosis not present

## 2018-03-28 MED ORDER — AMOXICILLIN 875 MG PO TABS: 875.0000 mg | ORAL_TABLET | Freq: Two times a day (BID) | ORAL | 0 refills | Status: DC

## 2018-03-28 NOTE — Patient Instructions (Signed)

## 2018-03-28 NOTE — Progress Notes (Signed)
Patient: Darlene Alvarez Female    DOB: 10-10-1950   67 y.o.   MRN: [id number] Visit Date: 03/28/2018  Today's Provider: Margaretann LovelessJennifer M Linsey Hirota, PA-C   Chief Complaint  Patient presents with  . Ear Pain   Subjective:     HPI Patient here today c/o left ear pain radiating to left side of throat. Patient reports PND and runny nose denies any cough. Patient reports she is taking coricidin. Patient reports right eye is red. Patient reports symptoms have been present for 5 days.   Allergies  Allergen Reactions  . Oxycodone-Acetaminophen Hives  . Sulfa Antibiotics Hives     Current Outpatient Medications:  .  amLODipine (NORVASC) 5 MG tablet, TAKE 1 TABLET BY MOUTH EVERY DAY, Disp: 90 tablet, Rfl: 1  Review of Systems  Constitutional: Negative.   HENT: Positive for congestion, ear pain, postnasal drip, rhinorrhea and sore throat. Negative for hearing loss.   Respiratory: Negative.   Cardiovascular: Negative.   Neurological: Negative.     Social History   Tobacco Use  . Smoking status: Never Smoker  . Smokeless tobacco: Never Used  Substance Use Topics  . Alcohol use: No    Frequency: Never      Objective:   BP (!) 161/77 (BP Location: Left Arm, Patient Position: Sitting, Cuff Size: Normal)   Pulse 76   Temp 98.1 F (36.7 C) (Oral)   Resp 16   Wt 234 lb (106.1 kg)   SpO2 98%   BMI 31.74 kg/m  Vitals:   03/28/18 1749  BP: (!) 161/77  Pulse: 76  Resp: 16  Temp: 98.1 F (36.7 C)  TempSrc: Oral  SpO2: 98%  Weight: 234 lb (106.1 kg)     Physical Exam Vitals signs reviewed.  Constitutional:      General: She is not in acute distress.    Appearance: She is well-developed. She is not diaphoretic.  HENT:     Head: Normocephalic and atraumatic.     Right Ear: Hearing, tympanic membrane, ear canal and external ear normal.     Left Ear: Hearing, ear canal and external ear normal. Tenderness present. A middle ear effusion (opaque) is present.  Tympanic membrane is erythematous and bulging.     Nose: Nose normal.     Mouth/Throat:     Pharynx: Uvula midline. Posterior oropharyngeal erythema present. No oropharyngeal exudate.  Eyes:     General: No scleral icterus.       Right eye: No discharge.        Left eye: No discharge.     Conjunctiva/sclera: Conjunctivae normal.     Pupils: Pupils are equal, round, and reactive to light.  Neck:     Musculoskeletal: Normal range of motion and neck supple.     Thyroid: No thyromegaly.     Trachea: No tracheal deviation.  Cardiovascular:     Rate and Rhythm: Normal rate and regular rhythm.     Heart sounds: Normal heart sounds. No murmur. No friction rub. No gallop.   Pulmonary:     Effort: Pulmonary effort is normal. No respiratory distress.     Breath sounds: Normal breath sounds. No stridor. No wheezing or rales.  Lymphadenopathy:     Cervical: No cervical adenopathy.  Skin:    General: Skin is warm and dry.         Assessment & Plan    1. Non-recurrent acute suppurative otitis media of left ear without spontaneous rupture of  tympanic membrane Worsening symptoms that have not responded to OTC medications. Will give amoxicillin as below. Continue allergy medications. Stay well hydrated and get plenty of rest. Call if no symptom improvement or if symptoms worsen. - amoxicillin (AMOXIL) 875 MG tablet; Take 1 tablet (875 mg total) by mouth 2 (two) times daily.  Dispense: 20 tablet; Refill: 0     Margaretann LovelessJennifer M Keoki Mchargue, PA-C  [facility]

## 2018-04-14 NOTE — Progress Notes (Signed)
Patient: Darlene Alvarez Female    DOB: [birth year]   68 y.o.   MRN: [id number] Visit Date: 04/15/2018  Today's Provider: Margaretann Loveless, PA-C   Chief Complaint  Patient presents with  . Follow-up   Subjective:     HPI   Non-recurrent acute suppurative otitis media of left ear without spontaneous rupture of tympanic membrane From 03/28/2018-given amoxicillin. Advised to continue allergy medications. Stay well hydrated and get plenty of rest. Call if no symptom improvement or if symptoms worsen.  Patient completed antibiotic, however her left ear is still bothering her. Patient has no other symptoms.   Also patient states she hit her left pinky toe on the corner of a wall about 1 week ago. Patient states it was swollen at first and very painful. Patient states swelling has gone down but her toe is still painful.    Allergies  Allergen Reactions  . Oxycodone-Acetaminophen Hives  . Sulfa Antibiotics Hives     Current Outpatient Medications:  .  amLODipine (NORVASC) 5 MG tablet, TAKE 1 TABLET BY MOUTH EVERY DAY, Disp: 90 tablet, Rfl: 1 .  amoxicillin (AMOXIL) 875 MG tablet, Take 1 tablet (875 mg total) by mouth 2 (two) times daily. (Patient not taking: Reported on 04/15/2018), Disp: 20 tablet, Rfl: 0  Review of Systems  Constitutional: Negative for appetite change, chills, fatigue and fever.  Respiratory: Negative for chest tightness and shortness of breath.   Cardiovascular: Negative for chest pain and palpitations.  Gastrointestinal: Negative for abdominal pain, nausea and vomiting.  Neurological: Negative for dizziness and weakness.    Social History   Tobacco Use  . Smoking status: Never Smoker  . Smokeless tobacco: Never Used  Substance Use Topics  . Alcohol use: No    Frequency: Never      Objective:   BP 125/84 (BP Location: Left Arm, Patient Position: Sitting, Cuff Size: Large)   Pulse 82   Temp 98.1 F (36.7 C) (Oral)   Resp 16   Wt 231  lb (104.8 kg)   SpO2 97%   BMI 31.33 kg/m  Vitals:   04/15/18 0708  BP: 125/84  Pulse: 82  Resp: 16  Temp: 98.1 F (36.7 C)  TempSrc: Oral  SpO2: 97%  Weight: 231 lb (104.8 kg)     Physical Exam Vitals signs reviewed.  Constitutional:      General: She is not in acute distress.    Appearance: She is well-developed. She is not diaphoretic.  HENT:     Head: Normocephalic and atraumatic.     Right Ear: Hearing, tympanic membrane, ear canal and external ear normal.     Left Ear: Hearing, ear canal and external ear normal. A middle ear effusion (clear) is present.     Nose: Nose normal.     Mouth/Throat:     Pharynx: Uvula midline. No oropharyngeal exudate.  Eyes:     General: No scleral icterus.       Right eye: No discharge.        Left eye: No discharge.     Conjunctiva/sclera: Conjunctivae normal.     Pupils: Pupils are equal, round, and reactive to light.  Neck:     Musculoskeletal: Normal range of motion and neck supple.     Thyroid: No thyromegaly.     Trachea: No tracheal deviation.  Cardiovascular:     Rate and Rhythm: Normal rate and regular rhythm.     Heart sounds: Normal heart sounds.  No murmur. No friction rub. No gallop.   Pulmonary:     Effort: Pulmonary effort is normal. No respiratory distress.     Breath sounds: Normal breath sounds. No stridor. No wheezing or rales.  Musculoskeletal:     Left foot: Normal range of motion and normal capillary refill. Tenderness (over right 5th toe) present. No bony tenderness, swelling, crepitus, deformity or laceration.  Lymphadenopathy:     Cervical: No cervical adenopathy.  Skin:    General: Skin is warm and dry.        Assessment & Plan    1. ETD (Eustachian tube dysfunction), left Discussed steroid nasal sprays. Patient declines. Advised she may try sudafed 10mg  for no more than 3-5 days (blood pressure). Call if still no improvements.  2. Contusion of lesser toe of left foot without damage to nail,  initial encounter Continue conservative management with buddy taping, NSAIDs prn, ice prn and elevation. Call if worsening.      Margaretann LovelessJennifer M Burnette, PA-C  [facility]

## 2018-04-15 ENCOUNTER — Ambulatory Visit (INDEPENDENT_AMBULATORY_CARE_PROVIDER_SITE_OTHER): Payer: Medicare Other | Admitting: Physician Assistant

## 2018-04-15 ENCOUNTER — Encounter: Payer: Self-pay | Admitting: Physician Assistant

## 2018-04-15 VITALS — BP 125/84 | HR 82 | Temp 98.1°F | Resp 16 | Wt 231.0 lb

## 2018-04-15 DIAGNOSIS — H6982 Other specified disorders of Eustachian tube, left ear: Secondary | ICD-10-CM | POA: Diagnosis not present

## 2018-04-15 DIAGNOSIS — S90122A Contusion of left lesser toe(s) without damage to nail, initial encounter: Secondary | ICD-10-CM

## 2018-06-20 ENCOUNTER — Other Ambulatory Visit: Payer: Self-pay | Admitting: Physician Assistant

## 2018-06-20 DIAGNOSIS — I10 Essential (primary) hypertension: Secondary | ICD-10-CM

## 2018-06-23 ENCOUNTER — Telehealth: Payer: Self-pay | Admitting: Physician Assistant

## 2018-06-23 NOTE — Telephone Encounter (Signed)
I left a message asking the patient to call and schedule AWV (initial) with Mckenzie at the end of this month.  If patient calls back, please schedule AWV-I on or after 07/06/2018 (Welcome to Medicare was 07/04/17). VDM (DD)

## 2018-07-30 ENCOUNTER — Ambulatory Visit (INDEPENDENT_AMBULATORY_CARE_PROVIDER_SITE_OTHER): Payer: Medicare Other | Admitting: Physician Assistant

## 2018-07-30 ENCOUNTER — Other Ambulatory Visit: Payer: Self-pay

## 2018-07-30 ENCOUNTER — Encounter: Payer: Self-pay | Admitting: Physician Assistant

## 2018-07-30 VITALS — BP 110/70 | HR 72 | Temp 98.0°F | Resp 16 | Wt 232.0 lb

## 2018-07-30 DIAGNOSIS — S39012A Strain of muscle, fascia and tendon of lower back, initial encounter: Secondary | ICD-10-CM | POA: Diagnosis not present

## 2018-07-30 MED ORDER — CYCLOBENZAPRINE HCL 5 MG PO TABS: 5.0000 mg | ORAL_TABLET | Freq: Three times a day (TID) | ORAL | 1 refills | Status: DC | PRN

## 2018-07-30 NOTE — Progress Notes (Signed)
Patient: Darlene Alvarez Female    DOB: 1950/08/07   68 y.o.   MRN: [id number] Visit Date: 07/30/2018  Today's Provider: Margaretann Loveless, PA-C   Chief Complaint  Patient presents with  . Back Pain   Subjective:     HPI  Patient has been having back pain for over 1 week. Patient states pain is in her lower back. She was moving furniture with her husband a week ago and felt it catch when she lifted something without squatting. Patient states she has been slowy improving with hot showers and Tylenol. Patient states pain does not radiate. She reports she is approx 80% better at this time.   Allergies  Allergen Reactions  . Oxycodone-Acetaminophen Hives  . Sulfa Antibiotics Hives     Current Outpatient Medications:  .  amLODipine (NORVASC) 5 MG tablet, TAKE 1 TABLET BY MOUTH EVERY DAY, Disp: 90 tablet, Rfl: 0  Review of Systems  Constitutional: Negative for appetite change, chills, fatigue and fever.  Respiratory: Negative for chest tightness and shortness of breath.   Cardiovascular: Negative for chest pain and palpitations.  Gastrointestinal: Negative for abdominal pain, nausea and vomiting.  Musculoskeletal: Positive for back pain. Negative for myalgias.  Neurological: Negative for dizziness, weakness and numbness.    Social History   Tobacco Use  . Smoking status: Never Smoker  . Smokeless tobacco: Never Used  Substance Use Topics  . Alcohol use: No    Frequency: Never      Objective:   BP 110/70 (BP Location: Right Arm, Patient Position: Sitting, Cuff Size: Large)   Pulse 72   Temp 98 F (36.7 C) (Oral)   Resp 16   Wt 232 lb (105.2 kg)   SpO2 99%   BMI 31.46 kg/m  Vitals:   07/30/18 0817  BP: 110/70  Pulse: 72  Resp: 16  Temp: 98 F (36.7 C)  TempSrc: Oral  SpO2: 99%  Weight: 232 lb (105.2 kg)     Physical Exam Vitals signs reviewed.  Constitutional:      General: She is not in acute distress.    Appearance: Normal appearance.  She is well-developed. She is obese. She is not diaphoretic.  Neck:     Musculoskeletal: Normal range of motion and neck supple.     Thyroid: No thyromegaly.     Vascular: No JVD.     Trachea: No tracheal deviation.  Cardiovascular:     Rate and Rhythm: Normal rate and regular rhythm.     Heart sounds: Normal heart sounds. No murmur. No friction rub. No gallop.   Pulmonary:     Effort: Pulmonary effort is normal. No respiratory distress.     Breath sounds: Normal breath sounds. No wheezing or rales.  Musculoskeletal:     Lumbar back: She exhibits tenderness. She exhibits normal range of motion, no bony tenderness, no pain and no spasm.       Back:  Lymphadenopathy:     Cervical: No cervical adenopathy.  Neurological:     Mental Status: She is alert.         Assessment & Plan    1. Low back strain, initial encounter Continue tylenol and moist heat. Will give flexeril as below for spasm. Back exercises printed for patient. Call if worsening and will consider imaging.  - cyclobenzaprine (FLEXERIL) 5 MG tablet; Take 1 tablet (5 mg total) by mouth 3 (three) times daily as needed for muscle spasms.  Dispense: 30 tablet;  Refill: [place], PA-C  [facility]

## 2018-07-30 NOTE — Patient Instructions (Signed)

## 2018-08-04 ENCOUNTER — Other Ambulatory Visit: Payer: Self-pay

## 2018-08-04 ENCOUNTER — Ambulatory Visit (INDEPENDENT_AMBULATORY_CARE_PROVIDER_SITE_OTHER): Payer: Medicare Other

## 2018-08-04 DIAGNOSIS — Z0000 Encounter for general adult medical examination without abnormal findings: Secondary | ICD-10-CM

## 2018-08-04 NOTE — Patient Instructions (Addendum)
Darlene Alvarez , Thank you for taking time to come for your Medicare Wellness Visit. I appreciate your ongoing commitment to your health goals. Please review the following plan we discussed and let me know if I can assist you in the future.   Screening recommendations/referrals: Colonoscopy: Up to date, due 12/2021 Mammogram: Up to date, due 07/2019 Bone Density: Up to date, due 09/2026 Recommended yearly ophthalmology/optometry visit for glaucoma screening and checkup Recommended yearly dental visit for hygiene and checkup  Vaccinations: Influenza vaccine: Pt declines today.  Pneumococcal vaccine: Pt declines today.  Tdap vaccine: Pt declines today.  Shingles vaccine: Pt declines today.     Advanced directives: Advance directive discussed with you today. Even though you declined this today please call our office should you change your mind and we can give you the proper paperwork for you to fill out.  Conditions/risks identified: Recommend to exercise for 3 days a week for at least 30 minutes at a time.   Next appointment: 09/15/18 @ 3:00 PM with Joycelyn Man.    Preventive Care 2 Years and Older, Female Preventive care refers to lifestyle choices and visits with your health care provider that can promote health and wellness. What does preventive care include?  A yearly physical exam. This is also called an annual well check.  Dental exams once or twice a year.  Routine eye exams. Ask your health care provider how often you should have your eyes checked.  Personal lifestyle choices, including:  Daily care of your teeth and gums.  Regular physical activity.  Eating a healthy diet.  Avoiding tobacco and drug use.  Limiting alcohol use.  Practicing safe sex.  Taking low-dose aspirin every day.  Taking vitamin and mineral supplements as recommended by your health care provider. What happens during an annual well check? The services and screenings done by your  health care provider during your annual well check will depend on your age, overall health, lifestyle risk factors, and family history of disease. Counseling  Your health care provider may ask you questions about your:  Alcohol use.  Tobacco use.  Drug use.  Emotional well-being.  Home and relationship well-being.  Sexual activity.  Eating habits.  History of falls.  Memory and ability to understand (cognition).  Work and work Astronomer.  Reproductive health. Screening  You may have the following tests or measurements:  Height, weight, and BMI.  Blood pressure.  Lipid and cholesterol levels. These may be checked every 5 years, or more frequently if you are over 23 years old.  Skin check.  Lung cancer screening. You may have this screening every year starting at age 15 if you have a 30-pack-year history of smoking and currently smoke or have quit within the past 15 years.  Fecal occult blood test (FOBT) of the stool. You may have this test every year starting at age 24.  Flexible sigmoidoscopy or colonoscopy. You may have a sigmoidoscopy every 5 years or a colonoscopy every 10 years starting at age 9.  Hepatitis C blood test.  Hepatitis B blood test.  Sexually transmitted disease (STD) testing.  Diabetes screening. This is done by checking your blood sugar (glucose) after you have not eaten for a while (fasting). You may have this done every 1-3 years.  Bone density scan. This is done to screen for osteoporosis. You may have this done starting at age 11.  Mammogram. This may be done every 1-2 years. Talk to your health care provider about how often  you should have regular mammograms. Talk with your health care provider about your test results, treatment options, and if necessary, the need for more tests. Vaccines  Your health care provider may recommend certain vaccines, such as:  Influenza vaccine. This is recommended every year.  Tetanus, diphtheria, and  acellular pertussis (Tdap, Td) vaccine. You may need a Td booster every 10 years.  Zoster vaccine. You may need this after age 81.  Pneumococcal 13-valent conjugate (PCV13) vaccine. One dose is recommended after age 37.  Pneumococcal polysaccharide (PPSV23) vaccine. One dose is recommended after age 49. Talk to your health care provider about which screenings and vaccines you need and how often you need them. This information is not intended to replace advice given to you by your health care provider. Make sure you discuss any questions you have with your health care provider. Document Released: 04/22/2015 Document Revised: 12/14/2015 Document Reviewed: 01/25/2015 Elsevier Interactive Patient Education  2017 [employer] Prevention in the Home Falls can cause injuries. They can happen to people of all ages. There are many things you can do to make your home safe and to help prevent falls. What can I do on the outside of my home?  Regularly fix the edges of walkways and driveways and fix any cracks.  Remove anything that might make you trip as you walk through a door, such as a raised step or threshold.  Trim any bushes or trees on the path to your home.  Use bright outdoor lighting.  Clear any walking paths of anything that might make someone trip, such as rocks or tools.  Regularly check to see if handrails are loose or broken. Make sure that both sides of any steps have handrails.  Any raised decks and porches should have guardrails on the edges.  Have any leaves, snow, or ice cleared regularly.  Use sand or salt on walking paths during winter.  Clean up any spills in your garage right away. This includes oil or grease spills. What can I do in the bathroom?  Use night lights.  Install grab bars by the toilet and in the tub and shower. Do not use towel bars as grab bars.  Use non-skid mats or decals in the tub or shower.  If you need to sit down in the shower, use  a plastic, non-slip stool.  Keep the floor dry. Clean up any water that spills on the floor as soon as it happens.  Remove soap buildup in the tub or shower regularly.  Attach bath mats securely with double-sided non-slip rug tape.  Do not have throw rugs and other things on the floor that can make you trip. What can I do in the bedroom?  Use night lights.  Make sure that you have a light by your bed that is easy to reach.  Do not use any sheets or blankets that are too big for your bed. They should not hang down onto the floor.  Have a firm chair that has side arms. You can use this for support while you get dressed.  Do not have throw rugs and other things on the floor that can make you trip. What can I do in the kitchen?  Clean up any spills right away.  Avoid walking on wet floors.  Keep items that you use a lot in easy-to-reach places.  If you need to reach something above you, use a strong step stool that has a grab bar.  Keep electrical cords  out of the way.  Do not use floor polish or wax that makes floors slippery. If you must use wax, use non-skid floor wax.  Do not have throw rugs and other things on the floor that can make you trip. What can I do with my stairs?  Do not leave any items on the stairs.  Make sure that there are handrails on both sides of the stairs and use them. Fix handrails that are broken or loose. Make sure that handrails are as long as the stairways.  Check any carpeting to make sure that it is firmly attached to the stairs. Fix any carpet that is loose or worn.  Avoid having throw rugs at the top or bottom of the stairs. If you do have throw rugs, attach them to the floor with carpet tape.  Make sure that you have a light switch at the top of the stairs and the bottom of the stairs. If you do not have them, ask someone to add them for you. What else can I do to help prevent falls?  Wear shoes that:  Do not have high heels.  Have  rubber bottoms.  Are comfortable and fit you well.  Are closed at the toe. Do not wear sandals.  If you use a stepladder:  Make sure that it is fully opened. Do not climb a closed stepladder.  Make sure that both sides of the stepladder are locked into place.  Ask someone to hold it for you, if possible.  Clearly mark and make sure that you can see:  Any grab bars or handrails.  First and last steps.  Where the edge of each step is.  Use tools that help you move around (mobility aids) if they are needed. These include:  Canes.  Walkers.  Scooters.  Crutches.  Turn on the lights when you go into a dark area. Replace any light bulbs as soon as they burn out.  Set up your furniture so you have a clear path. Avoid moving your furniture around.  If any of your floors are uneven, fix them.  If there are any pets around you, be aware of where they are.  Review your medicines with your doctor. Some medicines can make you feel dizzy. This can increase your chance of falling. Ask your doctor what other things that you can do to help prevent falls. This information is not intended to replace advice given to you by your health care provider. Make sure you discuss any questions you have with your health care provider. Document Released: 01/20/2009 Document Revised: 09/01/2015 Document Reviewed: 04/30/2014 Elsevier Interactive Patient Education  2017 [employer].

## 2018-08-04 NOTE — Progress Notes (Signed)
Subjective:   Darlene Alvarez is a 68 y.o. female who presents for an Initial Medicare Annual Wellness Visit.  This visit is being conducted via telephone due to the COVID-19 pandemic. This patient has given me verbal consent via telephone to conduct this visit. Some vital signs may be absent or patient reported.  Patient identification: identified by name, DOB, and current address.     Review of Systems    N/A  Cardiac Risk Factors include: advanced age (>4355men, 66>65 women);hypertension;obesity (BMI >30kg/m2)     Objective:    Today's Vitals   08/04/18 0839 08/04/18 0840  PainSc: 0-No pain 0-No pain   There is no height or weight on file to calculate BMI. Unable to obtain vitals due to visit being conducted via virtaully.   Advanced Directives 08/04/2018  Does Patient Have a Medical Advance Directive? No  Would patient like information on creating a medical advance directive? No - Patient declined    Current Medications (verified) Outpatient Encounter Medications as of 08/04/2018  Medication Sig  . amLODipine (NORVASC) 5 MG tablet TAKE 1 TABLET BY MOUTH EVERY DAY  . cyclobenzaprine (FLEXERIL) 5 MG tablet Take 1 tablet (5 mg total) by mouth 3 (three) times daily as needed for muscle spasms.   No facility-administered encounter medications on file as of 08/04/2018.     Allergies (verified) Oxycodone-acetaminophen and Sulfa antibiotics   History: Past Medical History:  Diagnosis Date  . Allergy    Past Surgical History:  Procedure Laterality Date  . ABDOMINAL HYSTERECTOMY    . CESAREAN SECTION     Family History  Problem Relation Age of Onset  . Diabetes Mother   . Alcohol abuse Mother   . Hypertension Father   . Lung disease Father   . Cancer Father        lung  . Diabetes Other   . Stroke Other   . Diabetes Sister   . Diabetes Brother   . Healthy Daughter   . Diabetes Sister   . Thyroid disease Sister   . Thyroid disease Sister   . Breast cancer Neg  Hx    Social History   Socioeconomic History  . Marital status: Married    Spouse name: Not on file  . Number of children: 1  . Years of education: Not on file  . Highest education level: Associate degree: occupational, Scientist, product/process developmenttechnical, or vocational program  Occupational History  . Occupation: social service    Comment: Program[employer], [employer]  Social Needs  . Financial resource strain: Not hard at all  . Food insecurity:    Worry: Never true    Inability: Never true  . Transportation needs:    Medical: No    Non-medical: No  Tobacco Use  . Smoking status: Never Smoker  . Smokeless tobacco: Never Used  Substance and Sexual Activity  . Alcohol use: No    Frequency: Never  . Drug use: No  . Sexual activity: Not on file  Lifestyle  . Physical activity:    Days per week: Patient refused    Minutes per session: Patient refused  . Stress: Not at all  Relationships  . Social connections:    Talks on phone: Patient refused    Gets together: Patient refused    Attends religious service: Patient refused    Active member of club or organization: Patient refused    Attends meetings of clubs or organizations: Patient refused    Relationship status: Patient refused  Other  Topics Concern  . Not on file  Social History Narrative  . Not on file    Tobacco Counseling Counseling given: Not Answered   Clinical Intake:  Pre-visit preparation completed: Yes  Pain : No/denies pain Pain Score: 0-No pain     Nutritional Status: BMI > 30  Obese Nutritional Risks: None Diabetes: No  How often do you need to have someone help you when you read instructions, pamphlets, or other written materials from your doctor or pharmacy?: 1 - Never  Interpreter Needed?: No  Information entered by :: [facility], LPN   Activities of Daily Living In your present state of health, do you have any difficulty performing the following activities: 08/04/2018  Hearing? N  Vision? N  Difficulty  concentrating or making decisions? N  Walking or climbing stairs? N  Dressing or bathing? N  Doing errands, shopping? N  Preparing Food and eating ? N  Using the Toilet? N  In the past six months, have you accidently leaked urine? N  Do you have problems with loss of bowel control? N  Managing your Medications? N  Managing your Finances? N  Housekeeping or managing your Housekeeping? N  Some recent data might be hidden     Immunizations and Health Maintenance  There is no immunization history on file for this patient. Health Maintenance Due  Topic Date Due  . TETANUS/TDAP  04/05/1971    Patient Care Team: Reine Just as PCP - General (Family Medicine)  Indicate any recent Medical Services you may have received from other than [facility] providers in the past year (date may be approximate).     Assessment:   This is a routine wellness examination for Itali.  Hearing/Vision screen No exam data present  Dietary issues and exercise activities discussed: Current Exercise Habits: The patient does not participate in regular exercise at present, Exercise limited by: orthopedic condition(s)  Goals    . Exercise 3x per week (30 min per time)     Recommend to exercise for 3 days a week for at least 30 minutes at a time.       Depression Screen PHQ 2/9 Scores 08/04/2018 07/04/2017 05/10/2017  PHQ - 2 Score 0 0 0  PHQ- 9 Score - 0 -    Fall Risk Fall Risk  08/04/2018 07/04/2017 05/10/2017  Falls in the past year? 0 No No   FALL RISK PREVENTION PERTAINING TO THE HOME:  Any stairs in or around the home? Yes  If so, are there any without handrails? Yes   Home free of loose throw rugs in walkways, pet beds, electrical cords, etc? Yes  Adequate lighting in your home to reduce risk of falls? Yes   ASSISTIVE DEVICES UTILIZED TO PREVENT FALLS:  Life alert? No  Use of a cane, walker or w/c? No  Grab bars in the bathroom? No  Shower chair or bench in shower? No  Elevated  toilet seat or a handicapped toilet? Yes    TIMED UP AND GO:  Was the test performed? No .     Cognitive Function:     6CIT Screen 08/04/2018  What Year? 0 points  What month? 0 points  What time? 0 points  Count back from 20 0 points  Months in reverse 0 points  Repeat phrase 0 points  Total Score 0    Screening Tests Health Maintenance  Topic Date Due  . TETANUS/TDAP  04/05/1971  . MAMMOGRAM  07/26/2019  . COLONOSCOPY  12/18/2021  .  DEXA SCAN  09/21/2026  . Hepatitis C Screening  Completed    Qualifies for Shingles Vaccine? Yes . Due for Shingrix. Education has been provided regarding the importance of this vaccine. Pt has been advised to call insurance company to determine out of pocket expense. Advised may also receive vaccine at local pharmacy or Health Dept. Verbalized acceptance and understanding.  Tdap: Although this vaccine is not a covered service during a Wellness Exam, does the patient still wish to receive this vaccine today?  No .  Advised may receive this vaccine at local pharmacy or Health Dept. Aware to provide a copy of the vaccination record if obtained from local pharmacy or Health Dept. Verbalized acceptance and understanding.  Flu Vaccine: Due for Flu vaccine. Does the patient want to receive this vaccine today?  No .  Advised may receive this vaccine at local pharmacy or Health Dept. Aware to provide a copy of the vaccination record if obtained from local pharmacy or Health Dept. Verbalized acceptance and understanding.   Cancer Screenings:  Colorectal Screening: Completed 9/11/8. Repeat every 5 years.  Mammogram: Completed 07/25/17.  Bone Density: Completed 09/20/16. Results reflect NORMAL. Repeat every 10 years.   Lung Cancer Screening: (Low Dose CT Chest recommended if Age 19-80 years, 30 pack-year currently smoking OR have quit w/in 15years.) does not qualify.   Additional Screening:  Hepatitis C Screening: Up to date  Vision Screening:  Recommended annual ophthalmology exams for early detection of glaucoma and other disorders of the eye.  Dental Screening: Recommended annual dental exams for proper oral hygiene  Community Resource Referral:  CRR required this visit?  No       Plan:  I have personally reviewed and addressed the Medicare Annual Wellness questionnaire and have noted the following in the patient's chart:  A. Medical and social history B. Use of alcohol, tobacco or illicit drugs  C. Current medications and supplements D. Functional ability and status E.  Nutritional status F.  Physical activity G. Advance directives H. List of other physicians I.  Hospitalizations, surgeries, and ER visits in previous 12 months J.  Vitals K. Screenings such as hearing and vision if needed, cognitive and depression L. Referrals and appointments   In addition, I have reviewed and discussed with patient certain preventive protocols, quality metrics, and best practice recommendations. A written personalized care plan for preventive services as well as general preventive health recommendations were provided to patient.   Darrick Huntsman, [place]   12/29/1938  Nurse Health Advisor   Nurse Notes: Pt declined the tetanus vaccine today.

## 2018-09-10 ENCOUNTER — Other Ambulatory Visit: Payer: Self-pay | Admitting: Physician Assistant

## 2018-09-10 DIAGNOSIS — Z1231 Encounter for screening mammogram for malignant neoplasm of breast: Secondary | ICD-10-CM

## 2018-09-15 ENCOUNTER — Other Ambulatory Visit: Payer: Self-pay | Admitting: Physician Assistant

## 2018-09-15 ENCOUNTER — Ambulatory Visit (INDEPENDENT_AMBULATORY_CARE_PROVIDER_SITE_OTHER): Payer: Medicare Other | Admitting: Physician Assistant

## 2018-09-15 ENCOUNTER — Encounter: Payer: Self-pay | Admitting: Physician Assistant

## 2018-09-15 ENCOUNTER — Other Ambulatory Visit: Payer: Self-pay

## 2018-09-15 VITALS — BP 145/82 | HR 72 | Temp 98.0°F | Resp 16 | Ht 72.0 in | Wt 231.0 lb

## 2018-09-15 DIAGNOSIS — E66811 Obesity, class 1: Secondary | ICD-10-CM

## 2018-09-15 DIAGNOSIS — Z6831 Body mass index (BMI) 31.0-31.9, adult: Secondary | ICD-10-CM

## 2018-09-15 DIAGNOSIS — E6609 Other obesity due to excess calories: Secondary | ICD-10-CM

## 2018-09-15 DIAGNOSIS — I10 Essential (primary) hypertension: Secondary | ICD-10-CM

## 2018-09-15 HISTORY — DX: Obesity, class 1: E66.811

## 2018-09-15 MED ORDER — DILTIAZEM HCL ER COATED BEADS 120 MG PO TB24: 120.0000 mg | ORAL_TABLET | Freq: Every day | ORAL | 1 refills | Status: DC

## 2018-09-15 NOTE — Patient Instructions (Signed)
Diltiazem extended-release capsules or tablets What is this medicine? DILTIAZEM (dil TYE a zem) is a calcium-channel blocker. It affects the amount of calcium found in your heart and muscle cells. This relaxes your blood vessels, which can reduce the amount of work the heart has to do. This medicine is used to treat high blood pressure and chest pain caused by angina. This medicine may be used for other purposes; ask your health care provider or pharmacist if you have questions. COMMON BRAND NAME(S): Cardizem CD, Cardizem LA, Cardizem SR, Cartia XT, Dilacor XR, Dilt-CD, Diltia XT, Diltzac, Matzim LA, Rema Fendt, Tiamate, Tiazac What should I tell my health care provider before I take this medicine? They need to know if you have any of these conditions: -heart problems, low blood pressure, irregular heartbeat -liver disease -previous heart attack -an unusual or allergic reaction to diltiazem, other medicines, foods, dyes, or preservatives -pregnant or trying to get pregnant -breast-feeding How should I use this medicine? Take this medicine by mouth with a glass of water. Follow the directions on the prescription label. Swallow whole, do not crush or chew. Ask your doctor or pharmacist if your should take this medicine with food. Take your doses at regular intervals. Do not take your medicine more often then directed. Do not stop taking except on the advice of your doctor or health care professional. Ask your doctor or health care professional how to gradually reduce the dose. Talk to your pediatrician regarding the use of this medicine in children. Special care may be needed. Overdosage: If you think you have taken too much of this medicine contact a poison control center or emergency room at once. NOTE: This medicine is only for you. Do not share this medicine with others. What if I miss a dose? If you miss a dose, take it as soon as you can. If it is almost time for your next dose, take only that  dose. Do not take double or extra doses. What may interact with this medicine? Do not take this medicine with any of the following medications: -cisapride -hawthorn -pimozide -ranolazine -red yeast rice This medicine may also interact with the following medications: -buspirone -carbamazepine -cimetidine -cyclosporine -digoxin -local anesthetics or general anesthetics -lovastatin -medicines for anxiety or difficulty sleeping like midazolam and triazolam -medicines for high blood pressure or heart problems -quinidine -rifampin, rifabutin, or rifapentine This list may not describe all possible interactions. Give your health care provider a list of all the medicines, herbs, non-prescription drugs, or dietary supplements you use. Also tell them if you smoke, drink alcohol, or use illegal drugs. Some items may interact with your medicine. What should I watch for while using this medicine? Check your blood pressure and pulse rate regularly. Ask your doctor or health care professional what your blood pressure and pulse rate should be and when you should contact him or her. You may feel dizzy or lightheaded. Do not drive, use machinery, or do anything that needs mental alertness until you know how this medicine affects you. To reduce the risk of dizzy or fainting spells, do not sit or stand up quickly, especially if you are an older patient. Alcohol can make you more dizzy or increase flushing and rapid heartbeats. Avoid alcoholic drinks. What side effects may I notice from receiving this medicine? Side effects that you should report to your doctor or health care professional as soon as possible: -allergic reactions like skin rash, itching or hives, swelling of the face, lips, or tongue -confusion,  mental depression -feeling faint or lightheaded, falls -redness, blistering, peeling or loosening of the skin, including inside the mouth -slow, irregular heartbeat -swelling of the feet and  ankles -unusual bleeding or bruising, pinpoint red spots on the skin Side effects that usually do not require medical attention (report to your doctor or health care professional if they continue or are bothersome): -constipation or diarrhea -difficulty sleeping -facial flushing -headache -nausea, vomiting -sexual dysfunction -weak or tired This list may not describe all possible side effects. Call your doctor for medical advice about side effects. You may report side effects to FDA at 1-800-FDA-1088. Where should I keep my medicine? Keep out of the reach of children. Store at room temperature between 15 and 30 degrees C (59 and 86 degrees F). Protect from humidity. Throw away any unused medicine after the expiration date. NOTE: This sheet is a summary. It may not cover all possible information. If you have questions about this medicine, talk to your doctor, pharmacist, or health care provider.  2019 Elsevier/Gold Standard (2007-07-17 14:35:47)

## 2018-09-15 NOTE — Progress Notes (Signed)
Patient: Darlene Alvarez Female    DOB: 04/07/1951   68 y.o.   MRN: [id number] Visit Date: 09/15/2018  Today's Provider: Margaretann LovelessJennifer M Mana Morison, PA-C   Chief Complaint  Patient presents with  . Follow-up  . Hypertension   Subjective:   Patient had AWV 08/04/2018 with [facility]. Up to date on all screenings.   HPI   Essential hypertension From 05/10/2017-n changes.   Lymphedema From 05/10/2017-Stable with conservative measures currently of compression stockings, elevating, and using HCTZ 25mg  prn.  BMI 30.0-30.9,adult From 05/10/2017-Counseled patient on healthy lifestyle modifications including dieting and exercise.    Allergies  Allergen Reactions  . Oxycodone-Acetaminophen Hives  . Sulfa Antibiotics Hives     Current Outpatient Medications:  .  amLODipine (NORVASC) 5 MG tablet, TAKE 1 TABLET BY MOUTH EVERY DAY, Disp: 90 tablet, Rfl: 0 .  cyclobenzaprine (FLEXERIL) 5 MG tablet, Take 1 tablet (5 mg total) by mouth 3 (three) times daily as needed for muscle spasms., Disp: 30 tablet, Rfl: 1  Review of Systems  Constitutional: Negative.   HENT: Negative.   Eyes: Positive for itching.  Respiratory: Negative.   Cardiovascular: Negative.   Gastrointestinal: Negative.   Endocrine: Negative.   Genitourinary: Negative.   Musculoskeletal: Negative.   Skin: Negative.   Allergic/Immunologic: Negative.   Neurological: Negative.   Hematological: Negative.   Psychiatric/Behavioral: Negative.     Social History   Tobacco Use  . Smoking status: Never Smoker  . Smokeless tobacco: Never Used  Substance Use Topics  . Alcohol use: No    Frequency: Never      Objective:   BP (!) 145/82 (BP Location: Left Arm, Patient Position: Sitting, Cuff Size: Large)   Pulse 72   Temp 98 F (36.7 C) (Oral)   Resp 16   Ht 6' (1.829 m)   Wt 231 lb (104.8 kg)   SpO2 99%   BMI 31.33 kg/m  Vitals:   09/15/18 1524  BP: (!) 145/82  Pulse: 72  Resp: 16  Temp: 98 F (36.7 C)   TempSrc: Oral  SpO2: 99%  Weight: 231 lb (104.8 kg)  Height: 6' (1.829 m)     Physical Exam Vitals signs reviewed.  Constitutional:      General: She is not in acute distress.    Appearance: Normal appearance. She is well-developed. She is obese. She is not ill-appearing or diaphoretic.  HENT:     Head: Normocephalic and atraumatic.     Right Ear: Tympanic membrane, ear canal and external ear normal.     Left Ear: Tympanic membrane, ear canal and external ear normal.     Nose: Nose normal.     Mouth/Throat:     Mouth: Mucous membranes are moist.     Pharynx: No oropharyngeal exudate.  Eyes:     General: No scleral icterus.       Right eye: No discharge.        Left eye: No discharge.     Extraocular Movements: Extraocular movements intact.     Conjunctiva/sclera: Conjunctivae normal.     Pupils: Pupils are equal, round, and reactive to light.  Neck:     Musculoskeletal: Normal range of motion and neck supple.     Thyroid: No thyromegaly.     Vascular: No JVD.     Trachea: No tracheal deviation.  Cardiovascular:     Rate and Rhythm: Normal rate and regular rhythm.     Heart sounds: Normal heart  sounds. No murmur. No friction rub. No gallop.   Pulmonary:     Effort: Pulmonary effort is normal. No respiratory distress.     Breath sounds: Normal breath sounds. No wheezing or rales.  Chest:     Chest wall: No tenderness.  Abdominal:     General: Bowel sounds are normal. There is no distension.     Palpations: Abdomen is soft. There is no mass.     Tenderness: There is no abdominal tenderness. There is no guarding or rebound.  Musculoskeletal: Normal range of motion.        General: No tenderness.     Right lower leg: Edema (2+ non-pitting) present.     Left lower leg: Edema (2+ non-pitting) present.  Lymphadenopathy:     Cervical: No cervical adenopathy.  Skin:    General: Skin is warm and dry.     Capillary Refill: Capillary refill takes less than 2 seconds.      Findings: No rash.  Neurological:     General: No focal deficit present.     Mental Status: She is alert and oriented to person, place, and time. Mental status is at baseline.     Cranial Nerves: No cranial nerve deficit.     Motor: No weakness.  Psychiatric:        Behavior: Behavior normal.        Thought Content: Thought content normal.        Judgment: Judgment normal.         Assessment & Plan    1. Essential hypertension Change therapy from Amlodipine 5mg  to Diltiazem XR 120mg  as below at patient request (she read an article stating that amlodipine was one of the worst BP medications to be on). Will check labs as below and f/u pending results. I will see her back in 6 months for HTN.  - diltiazem (CARDIZEM LA) 120 MG 24 hr tablet; Take 1 tablet (120 mg total) by mouth daily.  Dispense: 90 tablet; Refill: 1 - CBC w/Diff/Platelet - Comprehensive Metabolic Panel (CMET) - Lipid Profile  2. Class 1 obesity due to excess calories with serious comorbidity and body mass index (BMI) of 31.0 to 31.9 in adult Counseled patient on healthy lifestyle modifications including dieting and exercise. Has cut out sugar and limits salt from diet.  - CBC w/Diff/Platelet - Comprehensive Metabolic Panel (CMET) - Lipid Profile     Mar Daring, PA-C  [facility]

## 2018-09-16 ENCOUNTER — Other Ambulatory Visit: Payer: Self-pay | Admitting: Physician Assistant

## 2018-09-16 DIAGNOSIS — E6609 Other obesity due to excess calories: Secondary | ICD-10-CM | POA: Diagnosis not present

## 2018-09-16 DIAGNOSIS — Z6831 Body mass index (BMI) 31.0-31.9, adult: Secondary | ICD-10-CM | POA: Diagnosis not present

## 2018-09-16 DIAGNOSIS — I10 Essential (primary) hypertension: Secondary | ICD-10-CM

## 2018-09-17 LAB — CBC WITH DIFFERENTIAL/PLATELET
Basophils Absolute: 0 10*3/uL (ref 0.0–0.2)
Basos: 1 %
EOS (ABSOLUTE): 0.2 10*3/uL (ref 0.0–0.4)
Eos: 5 %
Hematocrit: 33.8 % — ABNORMAL LOW (ref 34.0–46.6)
Hemoglobin: 11.1 g/dL (ref 11.1–15.9)
Immature Grans (Abs): 0 10*3/uL (ref 0.0–0.1)
Immature Granulocytes: 0 %
Lymphocytes Absolute: 1.7 10*3/uL (ref 0.7–3.1)
Lymphs: 38 %
MCH: 28.2 pg (ref 26.6–33.0)
MCHC: 32.8 g/dL (ref 31.5–35.7)
MCV: 86 fL (ref 79–97)
Monocytes Absolute: 0.4 10*3/uL (ref 0.1–0.9)
Monocytes: 8 %
Neutrophils Absolute: 2.1 10*3/uL (ref 1.4–7.0)
Neutrophils: 48 %
Platelets: 271 10*3/uL (ref 150–450)
RBC: 3.94 x10E6/uL (ref 3.77–5.28)
RDW: 12.7 % (ref 11.7–15.4)
WBC: 4.4 10*3/uL (ref 3.4–10.8)

## 2018-09-17 LAB — LIPID PANEL
Chol/HDL Ratio: 2.6 ratio (ref 0.0–4.4)
Cholesterol, Total: 161 mg/dL (ref 100–199)
HDL: 62 mg/dL (ref >39)
LDL Calculated: 88 mg/dL (ref 0–99)
Triglycerides: 55 mg/dL (ref 0–149)
VLDL Cholesterol Cal: 11 mg/dL (ref 5–40)

## 2018-09-17 LAB — COMPREHENSIVE METABOLIC PANEL
ALT: 14 IU/L (ref 0–32)
AST: 15 IU/L (ref 0–40)
Albumin/Globulin Ratio: 1.1 — ABNORMAL LOW (ref 1.2–2.2)
Albumin: 3.8 g/dL (ref 3.8–4.8)
Alkaline Phosphatase: 142 IU/L — ABNORMAL HIGH (ref 39–117)
BUN/Creatinine Ratio: 16 (ref 12–28)
BUN: 13 mg/dL (ref 8–27)
Bilirubin Total: <0.2 mg/dL (ref 0.0–1.2)
CO2: 21 mmol/L (ref 20–29)
Calcium: 9.2 mg/dL (ref 8.7–10.3)
Chloride: 103 mmol/L (ref 96–106)
Creatinine, Ser: 0.79 mg/dL (ref 0.57–1.00)
GFR calc Af Amer: 90 mL/min/{1.73_m2} (ref >59)
GFR calc non Af Amer: 78 mL/min/{1.73_m2} (ref >59)
Globulin, Total: 3.4 g/dL (ref 1.5–4.5)
Glucose: 102 mg/dL — ABNORMAL HIGH (ref 65–99)
Potassium: 4.1 mmol/L (ref 3.5–5.2)
Sodium: 139 mmol/L (ref 134–144)
Total Protein: 7.2 g/dL (ref 6.0–8.5)

## 2018-09-18 ENCOUNTER — Ambulatory Visit
Admission: RE | Admit: 2018-09-18 | Discharge: 2018-09-18 | Disposition: A | Discharge status: Home / Self Care | Payer: Medicare Other | Source: Ambulatory Visit | Attending: Physician Assistant | Admitting: Physician Assistant

## 2018-09-18 ENCOUNTER — Other Ambulatory Visit: Payer: Self-pay

## 2018-09-18 ENCOUNTER — Other Ambulatory Visit: Payer: Self-pay | Admitting: Physician Assistant

## 2018-09-18 DIAGNOSIS — Z1231 Encounter for screening mammogram for malignant neoplasm of breast: Secondary | ICD-10-CM | POA: Insufficient documentation

## 2018-09-18 DIAGNOSIS — N6320 Unspecified lump in the left breast, unspecified quadrant: Secondary | ICD-10-CM

## 2018-09-18 DIAGNOSIS — R921 Mammographic calcification found on diagnostic imaging of breast: Secondary | ICD-10-CM

## 2018-09-18 DIAGNOSIS — R928 Other abnormal and inconclusive findings on diagnostic imaging of breast: Secondary | ICD-10-CM

## 2018-09-25 ENCOUNTER — Other Ambulatory Visit: Payer: Self-pay

## 2018-09-25 ENCOUNTER — Ambulatory Visit
Admission: RE | Admit: 2018-09-25 | Discharge: 2018-09-25 | Disposition: A | Discharge status: Home / Self Care | Payer: Medicare Other | Source: Ambulatory Visit | Attending: Physician Assistant | Admitting: Physician Assistant

## 2018-09-25 ENCOUNTER — Other Ambulatory Visit: Payer: Self-pay | Admitting: Physician Assistant

## 2018-09-25 DIAGNOSIS — N6320 Unspecified lump in the left breast, unspecified quadrant: Secondary | ICD-10-CM

## 2018-09-25 DIAGNOSIS — R921 Mammographic calcification found on diagnostic imaging of breast: Secondary | ICD-10-CM

## 2018-09-25 DIAGNOSIS — R928 Other abnormal and inconclusive findings on diagnostic imaging of breast: Secondary | ICD-10-CM | POA: Insufficient documentation

## 2018-09-25 DIAGNOSIS — N6459 Other signs and symptoms in breast: Secondary | ICD-10-CM | POA: Diagnosis not present

## 2018-09-25 DIAGNOSIS — N6489 Other specified disorders of breast: Secondary | ICD-10-CM | POA: Diagnosis not present

## 2018-10-01 ENCOUNTER — Other Ambulatory Visit: Payer: Self-pay

## 2018-10-01 ENCOUNTER — Ambulatory Visit
Admission: RE | Admit: 2018-10-01 | Discharge: 2018-10-01 | Disposition: A | Discharge status: Home / Self Care | Payer: Medicare Other | Source: Ambulatory Visit | Attending: Physician Assistant | Admitting: Physician Assistant

## 2018-10-01 DIAGNOSIS — R921 Mammographic calcification found on diagnostic imaging of breast: Secondary | ICD-10-CM

## 2018-10-01 DIAGNOSIS — R928 Other abnormal and inconclusive findings on diagnostic imaging of breast: Secondary | ICD-10-CM | POA: Insufficient documentation

## 2018-10-01 DIAGNOSIS — N6022 Fibroadenosis of left breast: Secondary | ICD-10-CM | POA: Diagnosis not present

## 2018-10-01 DIAGNOSIS — R920 Mammographic microcalcification found on diagnostic imaging of breast: Secondary | ICD-10-CM | POA: Diagnosis not present

## 2018-10-01 HISTORY — PX: BREAST BIOPSY: SHX20

## 2018-10-02 LAB — SURGICAL PATHOLOGY

## 2018-10-30 ENCOUNTER — Telehealth: Payer: Self-pay | Admitting: Physician Assistant

## 2018-10-30 DIAGNOSIS — Z20822 Contact with and (suspected) exposure to covid-19: Secondary | ICD-10-CM

## 2018-10-30 DIAGNOSIS — Z20828 Contact with and (suspected) exposure to other viral communicable diseases: Secondary | ICD-10-CM

## 2018-10-30 NOTE — Telephone Encounter (Signed)
I have ordered covid test. If she can get there before 4 she can be tested today. If not, she can go 1st thing in the morning.

## 2018-10-30 NOTE — Telephone Encounter (Signed)
Pt called saying her husband was tested for Covid on Monday and just got his positive results.  She said he is very sick and EMS came and picked him up.  He also has lung CA.    She wants to be tested.  She does not have any symptoms.  Can we place an order for her to be tested at the [facility] testing center    CB#  [phone]  Con [place]

## 2018-10-30 NOTE — Telephone Encounter (Signed)
Darlene Alvarez I think it is too late to get her tested today.  Please let me know if you talk to her or you want [place] to call her back after order is placed Thanks

## 2018-10-30 NOTE — Telephone Encounter (Signed)
Pt advised.  She will go over in the morning.    Thanks,   -Mickel Baas

## 2018-10-31 ENCOUNTER — Other Ambulatory Visit: Payer: Self-pay

## 2018-10-31 DIAGNOSIS — Z20822 Contact with and (suspected) exposure to covid-19: Secondary | ICD-10-CM

## 2018-11-01 LAB — NOVEL CORONAVIRUS, NAA: SARS-CoV-2, NAA: NOT DETECTED

## 2018-11-03 ENCOUNTER — Telehealth: Payer: Self-pay

## 2018-11-03 NOTE — Telephone Encounter (Signed)
Pt advised.   Thanks,   -Laura  

## 2018-11-03 NOTE — Telephone Encounter (Signed)
-----   Message from Mar Daring, PA-C sent at 11/03/2018  8:28 AM EDT ----- Covid negative currently. Limit interaction with husband and call if symptoms develop.

## 2018-12-23 NOTE — Progress Notes (Signed)
Patient: Darlene Alvarez Female    DOB: Aug 24, 1950   68 y.o.   MRN: [id number] Visit Date: 12/24/2018  Today's Provider: Margaretann LovelessJennifer M Burnette, PA-C   Chief Complaint  Patient presents with  . Hip Pain   Subjective:     Hip Pain  The incident occurred more than 1 week ago (about 1 month). The injury mechanism is unknown. The pain is present in the left hip, left ankle, left knee, left leg, left thigh and left heel. The quality of the pain is described as aching. The pain is at a severity of 10/10. The pain is severe. The pain has been constant since onset. Associated symptoms include an inability to bear weight and a loss of motion. Pertinent negatives include no loss of sensation, numbness or tingling. She reports no foreign bodies present. The symptoms are aggravated by weight bearing and movement. She has tried NSAIDs (Aleve) for the symptoms. The treatment provided moderate relief.   Patient reports she has not been able to lay on the left hip. She just started the aleve yesterday and is noticing this is helping quite a bit.    Allergies  Allergen Reactions  . Oxycodone-Acetaminophen Hives  . Sulfa Antibiotics Hives     Current Outpatient Medications:  .  diltiazem (DILT-XR) 120 MG 24 hr capsule, Take 1 capsule (120 mg total) by mouth daily., Disp: 90 capsule, Rfl: 1 .  naproxen sodium (ALEVE) 220 MG tablet, Take 220 mg by mouth., Disp: , Rfl:  .  cyclobenzaprine (FLEXERIL) 5 MG tablet, Take 1 tablet (5 mg total) by mouth 3 (three) times daily as needed for muscle spasms. (Patient not taking: Reported on 12/24/2018), Disp: 30 tablet, Rfl: 1  Review of Systems  Constitutional: Negative for appetite change, chills, fatigue and fever.  Respiratory: Negative for chest tightness and shortness of breath.   Cardiovascular: Negative for chest pain and palpitations.  Gastrointestinal: Negative for abdominal pain, nausea and vomiting.  Musculoskeletal: Positive for  arthralgias and gait problem (antalgic).  Neurological: Negative for dizziness, tingling, weakness and numbness.    Social History   Tobacco Use  . Smoking status: Never Smoker  . Smokeless tobacco: Never Used  Substance Use Topics  . Alcohol use: No    Frequency: Never      Objective:   BP 120/77 (BP Location: Right Arm, Patient Position: Sitting, Cuff Size: Large)   Pulse 72   Temp (!) 96.8 F (36 C) (Other (Comment))   Resp 16   Ht 6' (1.829 m)   Wt 230 lb (104.3 kg)   SpO2 97%   BMI 31.19 kg/m  Vitals:   12/24/18 0815  BP: 120/77  Pulse: 72  Resp: 16  Temp: (!) 96.8 F (36 C)  TempSrc: Other (Comment)  SpO2: 97%  Weight: 230 lb (104.3 kg)  Height: 6' (1.829 m)  Body mass index is 31.19 kg/m.   Physical Exam Vitals signs reviewed.  Constitutional:      General: She is not in acute distress.    Appearance: Normal appearance. She is well-developed. She is obese. She is not ill-appearing or diaphoretic.  Neck:     Musculoskeletal: Normal range of motion and neck supple.  Cardiovascular:     Rate and Rhythm: Normal rate and regular rhythm.     Heart sounds: Normal heart sounds. No murmur. No friction rub. No gallop.   Pulmonary:     Effort: Pulmonary effort is normal. No respiratory distress.  Breath sounds: Normal breath sounds. No wheezing or rales.  Musculoskeletal:     Left hip: She exhibits tenderness (over greater trochanter). She exhibits normal range of motion, normal strength and no bony tenderness.  Neurological:     Mental Status: She is alert.      No results found for any visits on 12/24/18.     Assessment & Plan    1. Trochanteric bursitis of left hip Advised her to continue Aleve BID x 2 weeks then change to voltaren gel OTC topically. Printed exercises and stretches. Discussed weight loss will help as well. Call if not improving and will consider steroid injection.      Mar Daring, PA-C  [facility]

## 2018-12-24 ENCOUNTER — Other Ambulatory Visit: Payer: Self-pay

## 2018-12-24 ENCOUNTER — Ambulatory Visit (INDEPENDENT_AMBULATORY_CARE_PROVIDER_SITE_OTHER): Payer: Medicare Other | Admitting: Physician Assistant

## 2018-12-24 ENCOUNTER — Encounter: Payer: Self-pay | Admitting: Physician Assistant

## 2018-12-24 VITALS — BP 120/77 | HR 72 | Temp 96.8°F | Resp 16 | Ht 72.0 in | Wt 230.0 lb

## 2018-12-24 DIAGNOSIS — M7062 Trochanteric bursitis, left hip: Secondary | ICD-10-CM

## 2018-12-24 NOTE — Patient Instructions (Addendum)
Voltaren Gel for hip pain  Hip Bursitis  Hip bursitis is inflammation of a fluid-filled sac (bursa) in the hip joint. The bursa prevents the bones in the hip joint from rubbing against each other. Hip bursitis can cause mild to moderate pain, and symptoms often come and go over time. What are the causes? This condition may be caused by:  Injury to the hip.  Overuse of the muscles that surround the hip joint.  Previous injury or surgery of the hip.  Arthritis or gout.  Diabetes.  Thyroid disease.  Infection. In some cases, the cause may not be known. What are the signs or symptoms? Symptoms of this condition include:  Mild or moderate pain in the hip area. Pain may get worse with movement.  Tenderness and swelling of the hip, especially on the outer side of the hip.  In rare cases, the bursa may become infected. This may cause a fever, as well as warmth and redness in the area. Symptoms may come and go. How is this diagnosed? This condition may be diagnosed based on:  A physical exam.  Your medical history.  X-rays.  Removal of fluid from your inflamed bursa for testing (biopsy). You may be sent to a health care provider who specializes in bone diseases (orthopedist) or a provider who specializes in joint inflammation (rheumatologist). How is this treated? This condition is treated by resting, icing, applying pressure (compression), and raising (elevating) the injured area. This is called RICE treatment. In some cases, this may be enough to make your symptoms go away. Treatment may also include:  Using crutches.  Draining fluid out of the bursa to help relieve swelling.  Injecting medicine that helps to reduce inflammation (cortisone).  Additional medicines if the bursa is infected. Follow these instructions at home: Managing pain, stiffness, and swelling   If directed, put ice on the painful area. ? Put ice in a plastic bag. ? Place a towel between your skin  and the bag. ? Leave the ice on for 20 minutes, 2-3 times a day. ? Raise (elevate) your hip above the level of your heart as much as you can without pain. To do this, try putting a pillow under your hips while you lie down. Activity  Return to your normal activities as told by your health care provider. Ask your health care provider what activities are safe for you.  Rest and protect your hip as much as possible until your pain and swelling get better. General instructions  Take over-the-counter and prescription medicines only as told by your health care provider.  Wear compression wraps only as told by your health care provider.  Do not use your hip to support your body weight until your health care provider says that you can. Use crutches as told by your health care provider.  Gently massage and stretch your injured area as often as is comfortable.  Keep all follow-up visits as told by your health care provider. This is important. How is this prevented?  Exercise regularly, as told by your health care provider.  Warm up and stretch before being active.  Cool down and stretch after being active.  If an activity irritates your hip or causes pain, avoid the activity as much as possible.  Avoid sitting down for long periods at a time. Contact a health care provider if you:  Have a fever.  Develop new symptoms.  Have difficulty walking or doing everyday activities.  Have pain that gets worse or does  not get better with medicine.  Develop red skin or a feeling of warmth in your hip area. Get help right away if you:  Cannot move your hip.  Have severe pain. Summary  Hip bursitis is inflammation of a fluid-filled sac (bursa) in the hip joint.  Hip bursitis can cause mild to moderate pain, and symptoms often come and go over time.  This condition is treated with rest, ice, compression, elevation, and medicines. This information is not intended to replace advice given to  you by your health care provider. Make sure you discuss any questions you have with your health care provider. Document Released: 09/15/2001 Document Revised: 12/02/2017 Document Reviewed: 12/02/2017 Elsevier Patient Education  2020 [employer]    Hip Exercises Ask your health care provider which exercises are safe for you. Do exercises exactly as told by your health care provider and adjust them as directed. It is normal to feel mild stretching, pulling, tightness, or discomfort as you do these exercises. Stop right away if you feel sudden pain or your pain gets worse. Do not begin these exercises until told by your health care provider. Stretching and range-of-motion exercises These exercises warm up your muscles and joints and improve the movement and flexibility of your hip. These exercises also help to relieve pain, numbness, and tingling. You may be asked to limit your range of motion if you had a hip replacement. Talk to your health care provider about these restrictions. Hamstrings, supine  1. Lie on your back (supine position). 2. Loop a belt or towel over the ball of your left / right foot. The ball of your foot is on the walking surface, right under your toes. 3. Straighten your left / right knee and slowly pull on the belt or towel to raise your leg until you feel a gentle stretch behind your knee (hamstring). ? Do not let your knee bend while you do this. ? Keep your other leg flat on the floor. 4. Hold this position for __________ seconds. 5. Slowly return your leg to the starting position. Repeat __________ times. Complete this exercise __________ times a day. Hip rotation  1. Lie on your back on a firm surface. 2. With your left / right hand, gently pull your left / right knee toward the shoulder that is on the same side of the body. Stop when your knee is pointing toward the ceiling. 3. Hold your left / right ankle with your other hand. 4. Keeping your knee steady, gently  pull your left / right ankle toward your other shoulder until you feel a stretch in your buttocks. ? Keep your hips and shoulders firmly planted while you do this stretch. 5. Hold this position for __________ seconds. Repeat __________ times. Complete this exercise __________ times a day. Seated stretch This exercise is sometimes called hamstrings and adductors stretch. 1. Sit on the floor with your legs stretched wide. Keep your knees straight during this exercise. 2. Keeping your head and back in a straight line, bend at your waist to reach for your left foot (position A). You should feel a stretch in your right inner thigh (adductors). 3. Hold this position for __________ seconds. Then slowly return to the upright position. 4. Keeping your head and back in a straight line, bend at your waist to reach forward (position B). You should feel a stretch behind both of your thighs and knees (hamstrings). 5. Hold this position for __________ seconds. Then slowly return to the upright position. 6.  Keeping your head and back in a straight line, bend at your waist to reach for your right foot (position C). You should feel a stretch in your left inner thigh (adductors). 7. Hold this position for __________ seconds. Then slowly return to the upright position. Repeat __________ times. Complete this exercise __________ times a day. Lunge This exercise stretches the muscles of the hip (hip flexors). 1. Place your left / right knee on the floor and bend your other knee so that is directly over your ankle. You should be half-kneeling. 2. Keep good posture with your head over your shoulders. 3. Tighten your buttocks to point your tailbone downward. This will prevent your back from arching too much. 4. You should feel a gentle stretch in the front of your left / right thigh and hip. If you do not feel a stretch, slide your other foot forward slightly and then slowly lunge forward with your chest up until your knee  once again lines up over your ankle. ? Make sure your tailbone continues to point downward. 5. Hold this position for __________ seconds. 6. Slowly return to the starting position. Repeat __________ times. Complete this exercise __________ times a day. Strengthening exercises These exercises build strength and endurance in your hip. Endurance is the ability to use your muscles for a long time, even after they get tired. Bridge This exercise strengthens the muscles of your hip (hip extensors). 1. Lie on your back on a firm surface with your knees bent and your feet flat on the floor. 2. Tighten your buttocks muscles and lift your bottom off the floor until the trunk of your body and your hips are level with your thighs. ? Do not arch your back. ? You should feel the muscles working in your buttocks and the back of your thighs. If you do not feel these muscles, slide your feet 1-2 inches (2.5-5 cm) farther away from your buttocks. 3. Hold this position for __________ seconds. 4. Slowly lower your hips to the starting position. 5. Let your muscles relax completely between repetitions. Repeat __________ times. Complete this exercise __________ times a day. Straight leg raises, side-lying This exercise strengthens the muscles that move the hip joint away from the center of the body (hip abductors). 1. Lie on your side with your left / right leg in the top position. Lie so your head, shoulder, hip, and knee line up. You may bend your bottom knee slightly to help you balance. 2. Roll your hips slightly forward, so your hips are stacked directly over each other and your left / right knee is facing forward. 3. Leading with your heel, lift your top leg 4-6 inches (10-15 cm). You should feel the muscles in your top hip lifting. ? Do not let your foot drift forward. ? Do not let your knee roll toward the ceiling. 4. Hold this position for __________ seconds. 5. Slowly return to the starting  position. 6. Let your muscles relax completely between repetitions. Repeat __________ times. Complete this exercise __________ times a day. Straight leg raises, side-lying This exercise strengthen the muscles that move the hip joint toward the center of the body (hip adductors). 1. Lie on your side with your left / right leg in the bottom position. Lie so your head, shoulder, hip, and knee line up. You may place your upper foot in front to help you balance. 2. Roll your hips slightly forward, so your hips are stacked directly over each other and your left /  right knee is facing forward. 3. Tense the muscles in your inner thigh and lift your bottom leg 4-6 inches (10-15 cm). 4. Hold this position for __________ seconds. 5. Slowly return to the starting position. 6. Let your muscles relax completely between repetitions. Repeat __________ times. Complete this exercise __________ times a day. Straight leg raises, supine This exercise strengthens the muscles in the front of your thigh (quadriceps). 1. Lie on your back (supine position) with your left / right leg extended and your other knee bent. 2. Tense the muscles in the front of your left / right thigh. You should see your kneecap slide up or see increased dimpling just above your knee. 3. Keep these muscles tight as you raise your leg 4-6 inches (10-15 cm) off the floor. Do not let your knee bend. 4. Hold this position for __________ seconds. 5. Keep these muscles tense as you lower your leg. 6. Relax the muscles slowly and completely between repetitions. Repeat __________ times. Complete this exercise __________ times a day. Hip abductors, standing This exercise strengthens the muscles that move the leg and hip joint away from the center of the body (hip abductors). 1. Tie one end of a rubber exercise band or tubing to a secure surface, such as a chair, table, or pole. 2. Loop the other end of the band or tubing around your left / right  ankle. 3. Keeping your ankle with the band or tubing directly opposite the secured end, step away until there is tension in the tubing or band. Hold on to a chair, table, or pole as needed for balance. 4. Lift your left / right leg out to your side. While you do this: ? Keep your back upright. ? Keep your shoulders over your hips. ? Keep your toes pointing forward. ? Make sure to use your hip muscles to slowly lift your leg. Do not tip your body or forcefully lift your leg. 5. Hold this position for __________ seconds. 6. Slowly return to the starting position. Repeat __________ times. Complete this exercise __________ times a day. Squats This exercise strengthens the muscles in the front of your thigh (quadriceps). 1. Stand in a door frame so your feet and knees are in line with the frame. You may place your hands on the frame for balance. 2. Slowly bend your knees and lower your hips like you are going to sit in a chair. ? Keep your lower legs in a straight-up-and-down position. ? Do not let your hips go lower than your knees. ? Do not bend your knees lower than told by your health care provider. ? If your hip pain increases, do not bend as low. 3. Hold this position for ___________ seconds. 4. Slowly push with your legs to return to standing. Do not use your hands to pull yourself to standing. Repeat __________ times. Complete this exercise __________ times a day. This information is not intended to replace advice given to you by your health care provider. Make sure you discuss any questions you have with your health care provider. Document Released: 04/13/2005 Document Revised: 02/04/2018 Document Reviewed: 02/04/2018 Elsevier Patient Education  2020 [employer].

## 2019-03-17 ENCOUNTER — Encounter: Payer: Self-pay | Admitting: Physician Assistant

## 2019-03-17 NOTE — Progress Notes (Signed)
Patient: Darlene Alvarez Female    DOB: 14-Feb-1951   68 y.o.   MRN: [id number] Visit Date: 03/18/2019  Today's Provider: Margaretann Loveless, PA-C   Chief Complaint  Patient presents with  . Follow-up    HTN   Subjective:    I,Joseline E. Rosas,RMA am acting as a Neurosurgeon for [employer], PA-C.  Virtual Visit via Telephone Note  I connected with Darlene Alvarez on 03/18/19 at  8:00 AM EST by telephone and verified that I am speaking with the correct person using two identifiers.  Location: Patient: Work Provider: [facility]   I discussed the limitations, risks, security and privacy concerns of performing an evaluation and management service by telephone and the availability of in person appointments. I also discussed with the patient that there may be a patient responsible charge related to this service. The patient expressed understanding and agreed to proceed.   HPI   Hypertension, follow-up:  BP Readings from Last 3 Encounters:  03/18/19 (!) 156/84  12/24/18 120/77  09/15/18 (!) 145/82    She was last seen for hypertension 6 months ago.  BP at that visit was 145/82. Management since that visit includes Change therapy from Amlodipine 5mg  to Diltiazem XR 120mg  . She reports excellent compliance with treatment. She is not having side effects.  She is not exercising. She is adherent to low salt diet.   Outside blood pressures are 120's/80's She is experiencing lower extremity edema.  Patient denies chest pain, chest pressure/discomfort, exertional chest pressure/discomfort, fatigue, irregular heart beat, near-syncope and palpitations.   Cardiovascular risk factors include advanced age (older than 24 for men, 4 for women) and hypertension.     Weight trend: decreasing steadily Wt Readings from Last 3 Encounters:  03/17/19 223 lb (101.2 kg)  12/24/18 230 lb (104.3 kg)  09/15/18 231 lb (104.8 kg)    Current diet: in general, a "healthy" diet     ------------------------------------------------------------------------   Allergies  Allergen Reactions  . Oxycodone-Acetaminophen Hives  . Sulfa Antibiotics Hives     Current Outpatient Medications:  .  diltiazem (DILT-XR) 120 MG 24 hr capsule, Take 1 capsule (120 mg total) by mouth daily., Disp: 90 capsule, Rfl: 1 .  cyclobenzaprine (FLEXERIL) 5 MG tablet, Take 1 tablet (5 mg total) by mouth 3 (three) times daily as needed for muscle spasms. (Patient not taking: Reported on 12/24/2018), Disp: 30 tablet, Rfl: 1 .  naproxen sodium (ALEVE) 220 MG tablet, Take 220 mg by mouth., Disp: , Rfl:   Review of Systems  Constitutional: Negative.   HENT: Negative.   Eyes: Negative for visual disturbance.  Respiratory: Negative.   Cardiovascular: Negative.   Neurological: Negative.     Social History   Tobacco Use  . Smoking status: Never Smoker  . Smokeless tobacco: Never Used  Substance Use Topics  . Alcohol use: No    Frequency: Never      Objective:   BP (!) 156/84 (BP Location: Left Arm, Patient Position: Sitting, Cuff Size: Normal)   Wt 223 lb (101.2 kg)   BMI 30.24 kg/m  Vitals:   03/17/19 1146 03/18/19 0812  BP:  (!) 156/84  Weight: 223 lb (101.2 kg)   Body mass index is 30.24 kg/m.   Physical Exam Vitals signs reviewed.  Constitutional:      General: She is not in acute distress. Pulmonary:     Effort: No respiratory distress.  Neurological:     Mental Status:  She is alert.     No results found for any visits on 03/18/19.     Assessment & Plan     1. Essential hypertension Stable. Diagnosis pulled for medication refill. Continue current medical treatment plan. Return in 6 months.  - diltiazem (DILT-XR) 120 MG 24 hr capsule; Take 1 capsule (120 mg total) by mouth daily.  Dispense: 90 capsule; Refill: 1  2. Class 1 obesity due to excess calories with serious comorbidity and body mass index (BMI) of 30.0 to 30.9 in adult Counseled patient on healthy  lifestyle modifications including dieting and exercise.  Patient has lost 7 pounds with portion control, increased water intake.   I discussed the assessment and treatment plan with the patient. The patient was provided an opportunity to ask questions and all were answered. The patient agreed with the plan and demonstrated an understanding of the instructions.   The patient was advised to call back or seek an in-person evaluation if the symptoms worsen or if the condition fails to improve as anticipated.  I provided 15 minutes of non-face-to-face time during this encounter.    Mar Daring, PA-C  [facility]

## 2019-03-18 ENCOUNTER — Encounter: Payer: Self-pay | Admitting: Physician Assistant

## 2019-03-18 ENCOUNTER — Ambulatory Visit (INDEPENDENT_AMBULATORY_CARE_PROVIDER_SITE_OTHER): Payer: Medicare Other | Admitting: Physician Assistant

## 2019-03-18 VITALS — BP 156/84 | Wt 223.0 lb

## 2019-03-18 DIAGNOSIS — E6609 Other obesity due to excess calories: Secondary | ICD-10-CM | POA: Diagnosis not present

## 2019-03-18 DIAGNOSIS — I10 Essential (primary) hypertension: Secondary | ICD-10-CM

## 2019-03-18 DIAGNOSIS — Z6830 Body mass index (BMI) 30.0-30.9, adult: Secondary | ICD-10-CM | POA: Diagnosis not present

## 2019-03-18 MED ORDER — DILTIAZEM HCL ER 120 MG PO CP24: 120.0000 mg | ORAL_CAPSULE | Freq: Every day | ORAL | 1 refills | Status: DC

## 2019-03-18 NOTE — Patient Instructions (Signed)

## 2019-06-08 ENCOUNTER — Other Ambulatory Visit: Payer: Self-pay

## 2019-06-08 ENCOUNTER — Encounter: Payer: Self-pay | Admitting: Physician Assistant

## 2019-06-08 ENCOUNTER — Ambulatory Visit (INDEPENDENT_AMBULATORY_CARE_PROVIDER_SITE_OTHER): Payer: Medicare Other | Admitting: Physician Assistant

## 2019-06-08 VITALS — BP 136/79 | HR 90 | Temp 96.6°F | Wt 228.0 lb

## 2019-06-08 DIAGNOSIS — H6121 Impacted cerumen, right ear: Secondary | ICD-10-CM | POA: Diagnosis not present

## 2019-06-08 NOTE — Progress Notes (Signed)
Patient: Darlene Alvarez Female    DOB: [birth year]   69 y.o.   MRN: [id number] Visit Date: 06/08/2019  Today's Provider: Mar Daring, PA-C   Chief Complaint  Patient presents with  . Ear Pain    Right ear   Subjective:     Otalgia  There is pain in the right ear. This is a new problem. The current episode started 1 to 4 weeks ago. The problem has been gradually improving. There has been no fever. Pertinent negatives include no ear discharge, headaches, rhinorrhea or sore throat.    Allergies  Allergen Reactions  . Oxycodone-Acetaminophen Hives  . Sulfa Antibiotics Hives     Current Outpatient Medications:  .  diltiazem (DILT-XR) 120 MG 24 hr capsule, Take 1 capsule (120 mg total) by mouth daily., Disp: 90 capsule, Rfl: 1 .  Multiple Vitamins-Minerals (CENTRUM SILVER 50+WOMEN PO), Take by mouth., Disp: , Rfl:  .  naproxen sodium (ALEVE) 220 MG tablet, Take 220 mg by mouth., Disp: , Rfl:   Review of Systems  Constitutional: Negative.   HENT: Positive for ear pain. Negative for congestion, drooling, ear discharge, postnasal drip, rhinorrhea, sinus pressure, sinus pain, sneezing, sore throat, tinnitus, trouble swallowing and voice change.   Eyes: Negative.   Respiratory: Negative.   Cardiovascular: Negative.   Gastrointestinal: Negative.   Neurological: Negative for dizziness, light-headedness and headaches.    Social History   Tobacco Use  . Smoking status: Never Smoker  . Smokeless tobacco: Never Used  Substance Use Topics  . Alcohol use: No      Objective:   BP 136/79 (BP Location: Left Arm, Patient Position: Sitting, Cuff Size: Large)   Pulse 90   Temp (!) 96.6 F (35.9 C) (Temporal)   Wt 228 lb (103.4 kg)   BMI 30.92 kg/m  Vitals:   06/08/19 1008  BP: 136/79  Pulse: 90  Temp: (!) 96.6 F (35.9 C)  TempSrc: Temporal  Weight: 228 lb (103.4 kg)  Body mass index is 30.92 kg/m.   Physical Exam Vitals reviewed.  Constitutional:        General: She is not in acute distress.    Appearance: Normal appearance. She is well-developed. She is obese. She is not ill-appearing or diaphoretic.  HENT:     Head: Normocephalic and atraumatic.     Right Ear: Hearing, tympanic membrane, ear canal and external ear normal.     Left Ear: Hearing, tympanic membrane, ear canal and external ear normal.  Eyes:     General: No scleral icterus.       Right eye: No discharge.        Left eye: No discharge.     Extraocular Movements: Extraocular movements intact.     Conjunctiva/sclera: Conjunctivae normal.     Pupils: Pupils are equal, round, and reactive to light.  Neck:     Thyroid: No thyromegaly.     Trachea: No tracheal deviation.  Cardiovascular:     Rate and Rhythm: Normal rate and regular rhythm.     Heart sounds: Normal heart sounds. No murmur. No friction rub. No gallop.   Pulmonary:     Effort: Pulmonary effort is normal. No respiratory distress.     Breath sounds: Normal breath sounds. No stridor. No wheezing or rales.  Musculoskeletal:     Cervical back: Normal range of motion and neck supple.  Lymphadenopathy:     Cervical: No cervical adenopathy.  Skin:    General:  Skin is warm and dry.  Neurological:     Mental Status: She is alert.      No results found for any visits on 06/08/19.     Assessment & Plan    1. Impacted cerumen of right ear Patient had removed on her own prior to appt. Symptoms of ear pain, fullness and hearing loss all resolved. No residual wax noted. Discussed using Debrox weekly to keep wax soft and easily removable.      Margaretann Loveless, PA-C  [facility]

## 2019-06-08 NOTE — Patient Instructions (Signed)
Earwax Buildup, Adult The ears produce a substance called earwax that helps keep bacteria out of the ear and protects the skin in the ear canal. Occasionally, earwax can build up in the ear and cause discomfort or hearing loss. What increases the risk? This condition is more likely to develop in people who:  Are female.  Are elderly.  Naturally produce more earwax.  Clean their ears often with cotton swabs.  Use earplugs often.  Use in-ear headphones often.  Wear hearing aids.  Have narrow ear canals.  Have earwax that is overly thick or sticky.  Have eczema.  Are dehydrated.  Have excess hair in the ear canal. What are the signs or symptoms? Symptoms of this condition include:  Reduced or muffled hearing.  A feeling of fullness in the ear or feeling that the ear is plugged.  Fluid coming from the ear.  Ear pain.  Ear itch.  Ringing in the ear.  Coughing.  An obvious piece of earwax that can be seen inside the ear canal. How is this diagnosed? This condition may be diagnosed based on:  Your symptoms.  Your medical history.  An ear exam. During the exam, your health care provider will look into your ear with an instrument called an otoscope. You may have tests, including a hearing test. How is this treated? This condition may be treated by:  Using ear drops to soften the earwax.  Having the earwax removed by a health care provider. The health care provider may: ? Flush the ear with water. ? Use an instrument that has a loop on the end (curette). ? Use a suction device.  Surgery to remove the wax buildup. This may be done in severe cases. Follow these instructions at home:   Take over-the-counter and prescription medicines only as told by your health care provider.  Do not put any objects, including cotton swabs, into your ear. You can clean the opening of your ear canal with a washcloth or facial tissue.  Follow instructions from your health care  provider about cleaning your ears. Do not over-clean your ears.  Drink enough fluid to keep your urine clear or pale yellow. This will help to thin the earwax.  Keep all follow-up visits as told by your health care provider. If earwax builds up in your ears often or if you use hearing aids, consider seeing your health care provider for routine, preventive ear cleanings. Ask your health care provider how often you should schedule your cleanings.  If you have hearing aids, clean them according to instructions from the manufacturer and your health care provider. Contact a health care provider if:  You have ear pain.  You develop a fever.  You have blood, pus, or other fluid coming from your ear.  You have hearing loss.  You have ringing in your ears that does not go away.  Your symptoms do not improve with treatment.  You feel like the room is spinning (vertigo). Summary  Earwax can build up in the ear and cause discomfort or hearing loss.  The most common symptoms of this condition include reduced or muffled hearing and a feeling of fullness in the ear or feeling that the ear is plugged.  This condition may be diagnosed based on your symptoms, your medical history, and an ear exam.  This condition may be treated by using ear drops to soften the earwax or by having the earwax removed by a health care provider.  Do not put any   objects, including cotton swabs, into your ear. You can clean the opening of your ear canal with a washcloth or facial tissue. This information is not intended to replace advice given to you by your health care provider. Make sure you discuss any questions you have with your health care provider. Document Revised: 03/08/2017 Document Reviewed: 06/06/2016 Elsevier Patient Education  2020 Elsevier Inc[employer]  

## 2019-08-04 NOTE — Progress Notes (Signed)
Subjective:   Darlene Alvarez is a 69 y.o. female who presents for Medicare Annual (Subsequent) preventive examination.    This visit is being conducted through telemedicine due to the COVID-19 pandemic. This patient has given me verbal consent via doximity to conduct this visit, patient states they are participating from their home address. Some vital signs may be absent or patient reported.    Patient identification: identified by name, DOB, and current address  Review of Systems:  N/A  Cardiac Risk Factors include: advanced age (>4men, >42 women);hypertension     Objective:     Vitals: There were no vitals taken for this visit.  There is no height or weight on file to calculate BMI. Unable to obtain vitals due to visit being conducted via telephonically.   Advanced Directives 08/05/2019 08/04/2018  Does Patient Have a Medical Advance Directive? No No  Would patient like information on creating a medical advance directive? No - Patient declined No - Patient declined    Tobacco Social History   Tobacco Use  Smoking Status Never Smoker  Smokeless Tobacco Never Used     Counseling given: Not Answered   Clinical Intake:  Pre-visit preparation completed: Yes  Pain : No/denies pain Pain Score: 0-No pain     Nutritional Risks: None Diabetes: No  How often do you need to have someone help you when you read instructions, pamphlets, or other written materials from your doctor or pharmacy?: 1 - Never  Interpreter Needed?: No  Information entered by :: [facility], LPN  Past Medical History:  Diagnosis Date  . Allergy   . Hypertension    Past Surgical History:  Procedure Laterality Date  . ABDOMINAL HYSTERECTOMY    . BREAST BIOPSY Left 10/01/2018   Affirm Biopsy- X-clip, path pending  . CESAREAN SECTION     Family History  Problem Relation Age of Onset  . Diabetes Mother   . Alcohol abuse Mother   . Hypertension Father   . Lung disease Father   .  Cancer Father        lung  . Diabetes Other   . Stroke Other   . Diabetes Sister   . Diabetes Brother   . Healthy Daughter   . Diabetes Sister   . Thyroid disease Sister   . Thyroid disease Sister   . Breast cancer Neg Hx    Social History   Socioeconomic History  . Marital status: Married    Spouse name: Not on file  . Number of children: 1  . Years of education: Not on file  . Highest education level: Associate degree: occupational, Hotel manager, or vocational program  Occupational History  . Occupation: social service    Comment: Catering manager.  Tobacco Use  . Smoking status: Never Smoker  . Smokeless tobacco: Never Used  Substance and Sexual Activity  . Alcohol use: No  . Drug use: No  . Sexual activity: Not on file  Other Topics Concern  . Not on file  Social History Narrative  . Not on file   Social Determinants of Health   Financial Resource Strain: Low Risk   . Difficulty of Paying Living Expenses: Not hard at all  Food Insecurity: No Food Insecurity  . Worried About Charity fundraiser in the Last Year: Never true  . Ran Out of Food in the Last Year: Never true  Transportation Needs: No Transportation Needs  . Lack of Transportation (Medical): No  . Lack of Transportation (Non-Medical):  No  Physical Activity: Inactive  . Days of Exercise per Week: 0 days  . Minutes of Exercise per Session: 0 min  Stress: No Stress Concern Present  . Feeling of Stress : Not at all  Social Connections: Slightly Isolated  . Frequency of Communication with Friends and Family: More than three times a week  . Frequency of Social Gatherings with Friends and Family: More than three times a week  . Attends Religious Services: More than 4 times per year  . Active Member of Clubs or Organizations: No  . Attends Banker Meetings: Never  . Marital Status: Married    Outpatient Encounter Medications as of 08/05/2019  Medication Sig  . diltiazem (DILT-XR) 120 MG  24 hr capsule Take 1 capsule (120 mg total) by mouth daily.  . Multiple Vitamins-Minerals (CENTRUM SILVER 50+WOMEN PO) Take by mouth daily.   . naproxen sodium (ALEVE) 220 MG tablet Take 220 mg by mouth.   No facility-administered encounter medications on file as of 08/05/2019.    Activities of Daily Living In your present state of health, do you have any difficulty performing the following activities: 08/05/2019 09/15/2018  Hearing? N N  Vision? N N  Difficulty concentrating or making decisions? N N  Walking or climbing stairs? N N  Dressing or bathing? N N  Doing errands, shopping? N N  Preparing Food and eating ? N -  Using the Toilet? N -  In the past six months, have you accidently leaked urine? N -  Do you have problems with loss of bowel control? N -  Managing your Medications? N -  Managing your Finances? N -  Housekeeping or managing your Housekeeping? N -  Some recent data might be hidden    Patient Care Team: Reine Just as PCP - General (Family Medicine)    Assessment:   This is a routine wellness examination for Darlene Alvarez.  Exercise Activities and Dietary recommendations Current Exercise Habits: The patient does not participate in regular exercise at present, Exercise limited by: None identified  Goals    . Exercise 3x per week (30 min per time)     Recommend to exercise for 3 days a week for at least 30 minutes at a time.        Fall Risk: Fall Risk  08/05/2019 09/15/2018 08/04/2018 07/04/2017 05/10/2017  Falls in the past year? 0 0 0 No No  Number falls in past yr: 0 0 - - -  Injury with Fall? 0 - - - -    FALL RISK PREVENTION PERTAINING TO THE HOME:  Any stairs in or around the home? Yes  If so, are there any without handrails? No   Home free of loose throw rugs in walkways, pet beds, electrical cords, etc? Yes  Adequate lighting in your home to reduce risk of falls? Yes   ASSISTIVE DEVICES UTILIZED TO PREVENT FALLS:  Life alert? No  Use of a  cane, walker or w/c? No  Grab bars in the bathroom? No  Shower chair or bench in shower? No  Elevated toilet seat or a handicapped toilet? Yes    TIMED UP AND GO:  Was the test performed? No .    Depression Screen PHQ 2/9 Scores 08/05/2019 09/15/2018 08/04/2018 07/04/2017  PHQ - 2 Score 0 0 0 0  PHQ- 9 Score - 0 - 0     Cognitive Function     6CIT Screen 08/05/2019 08/04/2018  What Year? 0 points  0 points  What month? 0 points 0 points  What time? 0 points 0 points  Count back from 20 0 points 0 points  Months in reverse 0 points 0 points  Repeat phrase 0 points 0 points  Total Score 0 0     There is no immunization history on file for this patient.  Qualifies for Shingles Vaccine? Yes . Due for Shingrix. Pt has been advised to call insurance company to determine out of pocket expense. Advised may also receive vaccine at local pharmacy or Health Dept. Verbalized acceptance and understanding.  Tdap: Although this vaccine is not a covered service during a Wellness Exam, does the patient still wish to receive this vaccine today?  No . Advised may receive this vaccine at local pharmacy or Health Dept. Aware to provide a copy of the vaccination record if obtained from local pharmacy or Health Dept. Verbalized acceptance and understanding.   Screening Tests Health Maintenance  Topic Date Due  . COVID-19 Vaccine (1) Never done  . TETANUS/TDAP  09/15/2019 (Originally 04/05/1970)  . MAMMOGRAM  09/17/2020  . COLONOSCOPY  12/18/2021  . DEXA SCAN  09/21/2026  . Hepatitis C Screening  Completed    Cancer Screenings:  Colorectal Screening: Completed 12/18/16. Repeat every 5 years.   Mammogram: Completed 10/01/18. Repeat every 1-2 years as advised.  Bone Density: Completed 09/20/16. Results reflect NORMAL. Repeat every 10 years.   Lung Cancer Screening: (Low Dose CT Chest recommended if Age 55-80 years, 30 pack-year currently smoking OR have quit w/in 15years.) does not qualify.    Additional Screening:  Hepatitis C Screening: Up to date  Vision Screening: Recommended annual ophthalmology exams for early detection of glaucoma and other disorders of the eye.  Dental Screening: Recommended annual dental exams for proper oral hygiene  Community Resource Referral:  CRR required this visit?  No       Plan:  I have personally reviewed and addressed the Medicare Annual Wellness questionnaire and have noted the following in the patient's chart:  A. Medical and social history B. Use of alcohol, tobacco or illicit drugs  C. Current medications and supplements D. Functional ability and status E.  Nutritional status F.  Physical activity G. Advance directives H. List of other physicians I.  Hospitalizations, surgeries, and ER visits in previous 12 months J.  Vitals K. Screenings such as hearing and vision if needed, cognitive and depression L. Referrals and appointments   In addition, I have reviewed and discussed with patient certain preventive protocols, quality metrics, and best practice recommendations. A written personalized care plan for preventive services as well as general preventive health recommendations were provided to patient. Nurse Health Advisor  Signed,    Joselito Fieldhouse [place], [place]  10/05/6379 Nurse Health Advisor   Nurse Notes: None.

## 2019-08-05 ENCOUNTER — Ambulatory Visit (INDEPENDENT_AMBULATORY_CARE_PROVIDER_SITE_OTHER): Payer: Medicare Other

## 2019-08-05 ENCOUNTER — Other Ambulatory Visit: Payer: Self-pay

## 2019-08-05 DIAGNOSIS — Z0000 Encounter for general adult medical examination without abnormal findings: Secondary | ICD-10-CM

## 2019-08-05 NOTE — Patient Instructions (Addendum)
Darlene Alvarez , Thank you for taking time to come for your Medicare Wellness Visit. I appreciate your ongoing commitment to your health goals. Please review the following plan we discussed and let me know if I can assist you in the future.   Screening recommendations/referrals: Colonoscopy: Up to date, due 12/2021 Mammogram: Up to date, due 09/2020 Bone Density: Up to date, due 09/2026 Recommended yearly ophthalmology/optometry visit for glaucoma screening and checkup Recommended yearly dental visit for hygiene and checkup  Vaccinations: Tdap vaccine: Pt declines today.  Shingles vaccine: Pt declines today.     Advanced directives: Advance directive discussed with you today. Even though you declined this today please call our office should you change your mind and we can give you the proper paperwork for you to fill out.  Conditions/risks identified: Recommend to start walking 3 days a week for 30 minutes at a time.   Next appointment: 09/21/19 @ 10:00 AM with Joycelyn Man   Preventive Care 68 Years and Older, Female Preventive care refers to lifestyle choices and visits with your health care provider that can promote health and wellness. What does preventive care include?  A yearly physical exam. This is also called an annual well check.  Dental exams once or twice a year.  Routine eye exams. Ask your health care provider how often you should have your eyes checked.  Personal lifestyle choices, including:  Daily care of your teeth and gums.  Regular physical activity.  Eating a healthy diet.  Avoiding tobacco and drug use.  Limiting alcohol use.  Practicing safe sex.  Taking low-dose aspirin every day.  Taking vitamin and mineral supplements as recommended by your health care provider. What happens during an annual well check? The services and screenings done by your health care provider during your annual well check will depend on your age, overall health, lifestyle  risk factors, and family history of disease. Counseling  Your health care provider may ask you questions about your:  Alcohol use.  Tobacco use.  Drug use.  Emotional well-being.  Home and relationship well-being.  Sexual activity.  Eating habits.  History of falls.  Memory and ability to understand (cognition).  Work and work Astronomer.  Reproductive health. Screening  You may have the following tests or measurements:  Height, weight, and BMI.  Blood pressure.  Lipid and cholesterol levels. These may be checked every 5 years, or more frequently if you are over 44 years old.  Skin check.  Lung cancer screening. You may have this screening every year starting at age 5 if you have a 30-pack-year history of smoking and currently smoke or have quit within the past 15 years.  Fecal occult blood test (FOBT) of the stool. You may have this test every year starting at age 39.  Flexible sigmoidoscopy or colonoscopy. You may have a sigmoidoscopy every 5 years or a colonoscopy every 10 years starting at age 72.  Hepatitis C blood test.  Hepatitis B blood test.  Sexually transmitted disease (STD) testing.  Diabetes screening. This is done by checking your blood sugar (glucose) after you have not eaten for a while (fasting). You may have this done every 1-3 years.  Bone density scan. This is done to screen for osteoporosis. You may have this done starting at age 67.  Mammogram. This may be done every 1-2 years. Talk to your health care provider about how often you should have regular mammograms. Talk with your health care provider about your test results,  treatment options, and if necessary, the need for more tests. Vaccines  Your health care provider may recommend certain vaccines, such as:  Influenza vaccine. This is recommended every year.  Tetanus, diphtheria, and acellular pertussis (Tdap, Td) vaccine. You may need a Td booster every 10 years.  Zoster vaccine.  You may need this after age 59.  Pneumococcal 13-valent conjugate (PCV13) vaccine. One dose is recommended after age 29.  Pneumococcal polysaccharide (PPSV23) vaccine. One dose is recommended after age 32. Talk to your health care provider about which screenings and vaccines you need and how often you need them. This information is not intended to replace advice given to you by your health care provider. Make sure you discuss any questions you have with your health care provider. Document Released: 04/22/2015 Document Revised: 12/14/2015 Document Reviewed: 01/25/2015 Elsevier Interactive Patient Education  2017 [place] Prevention in the Home Falls can cause injuries. They can happen to people of all ages. There are many things you can do to make your home safe and to help prevent falls. What can I do on the outside of my home?  Regularly fix the edges of walkways and driveways and fix any cracks.  Remove anything that might make you trip as you walk through a door, such as a raised step or threshold.  Trim any bushes or trees on the path to your home.  Use bright outdoor lighting.  Clear any walking paths of anything that might make someone trip, such as rocks or tools.  Regularly check to see if handrails are loose or broken. Make sure that both sides of any steps have handrails.  Any raised decks and porches should have guardrails on the edges.  Have any leaves, snow, or ice cleared regularly.  Use sand or salt on walking paths during winter.  Clean up any spills in your garage right away. This includes oil or grease spills. What can I do in the bathroom?  Use night lights.  Install grab bars by the toilet and in the tub and shower. Do not use towel bars as grab bars.  Use non-skid mats or decals in the tub or shower.  If you need to sit down in the shower, use a plastic, non-slip stool.  Keep the floor dry. Clean up any water that spills on the floor as soon  as it happens.  Remove soap buildup in the tub or shower regularly.  Attach bath mats securely with double-sided non-slip rug tape.  Do not have throw rugs and other things on the floor that can make you trip. What can I do in the bedroom?  Use night lights.  Make sure that you have a light by your bed that is easy to reach.  Do not use any sheets or blankets that are too big for your bed. They should not hang down onto the floor.  Have a firm chair that has side arms. You can use this for support while you get dressed.  Do not have throw rugs and other things on the floor that can make you trip. What can I do in the kitchen?  Clean up any spills right away.  Avoid walking on wet floors.  Keep items that you use a lot in easy-to-reach places.  If you need to reach something above you, use a strong step stool that has a grab bar.  Keep electrical cords out of the way.  Do not use floor polish or wax that makes floors  slippery. If you must use wax, use non-skid floor wax.  Do not have throw rugs and other things on the floor that can make you trip. What can I do with my stairs?  Do not leave any items on the stairs.  Make sure that there are handrails on both sides of the stairs and use them. Fix handrails that are broken or loose. Make sure that handrails are as long as the stairways.  Check any carpeting to make sure that it is firmly attached to the stairs. Fix any carpet that is loose or worn.  Avoid having throw rugs at the top or bottom of the stairs. If you do have throw rugs, attach them to the floor with carpet tape.  Make sure that you have a light switch at the top of the stairs and the bottom of the stairs. If you do not have them, ask someone to add them for you. What else can I do to help prevent falls?  Wear shoes that:  Do not have high heels.  Have rubber bottoms.  Are comfortable and fit you well.  Are closed at the toe. Do not wear sandals.  If  you use a stepladder:  Make sure that it is fully opened. Do not climb a closed stepladder.  Make sure that both sides of the stepladder are locked into place.  Ask someone to hold it for you, if possible.  Clearly mark and make sure that you can see:  Any grab bars or handrails.  First and last steps.  Where the edge of each step is.  Use tools that help you move around (mobility aids) if they are needed. These include:  Canes.  Walkers.  Scooters.  Crutches.  Turn on the lights when you go into a dark area. Replace any light bulbs as soon as they burn out.  Set up your furniture so you have a clear path. Avoid moving your furniture around.  If any of your floors are uneven, fix them.  If there are any pets around you, be aware of where they are.  Review your medicines with your doctor. Some medicines can make you feel dizzy. This can increase your chance of falling. Ask your doctor what other things that you can do to help prevent falls. This information is not intended to replace advice given to you by your health care provider. Make sure you discuss any questions you have with your health care provider. Document Released: 01/20/2009 Document Revised: 09/01/2015 Document Reviewed: 04/30/2014 Elsevier Interactive Patient Education  2017 [employer].

## 2019-08-14 ENCOUNTER — Telehealth: Payer: Self-pay

## 2019-08-14 NOTE — Telephone Encounter (Signed)
Patient reports that this has been resolved.

## 2019-08-14 NOTE — Telephone Encounter (Signed)
Copied from CRM [id number]. Topic: General - Inquiry >> Aug 13, 2019  4:37 PM Deborha Payment wrote: Reason for CRM: Patient is requesting a call back due to her husband medication, Patient did not want to give any other information. Call back [phone]

## 2019-09-18 NOTE — Progress Notes (Signed)
Established patient visit   Patient: Darlene Alvarez   DOB: 07-30-1950   69 y.o. Female  MRN: [id number] Visit Date: 09/21/2019  Today's healthcare provider: Mar Daring, PA-C   Chief Complaint  Patient presents with  . Follow-up    Hypertension   Subjective    HPI Hypertension, follow-up  BP Readings from Last 3 Encounters:  09/21/19 137/86  06/08/19 136/79  03/18/19 (!) 156/84   Wt Readings from Last 3 Encounters:  09/21/19 232 lb (105.2 kg)  06/08/19 228 lb (103.4 kg)  03/17/19 223 lb (101.2 kg)     She was last seen for hypertension 6 months ago.  BP at that visit was 136/79. Management since that visit includes Continue current medical treatment plan  She reports excellent compliance with treatment. She is not having side effects.  She is following a well balanced diet. She is not exercising. She does not smoke.  Use of agents associated with hypertension: none.   Outside blood pressures are not being checked. Symptoms: No chest pain No chest pressure  No palpitations No syncope  No dyspnea No orthopnea  No paroxysmal nocturnal dyspnea Yes lower extremity edema   Pertinent labs: Lab Results  Component Value Date   CHOL 161 09/16/2018   HDL 62 09/16/2018   LDLCALC 88 09/16/2018   TRIG 55 09/16/2018   CHOLHDL 2.6 09/16/2018   Lab Results  Component Value Date   NA 139 09/16/2018   K 4.1 09/16/2018   CREATININE 0.79 09/16/2018   GFRNONAA 78 09/16/2018   GFRAA 90 09/16/2018   GLUCOSE 102 (H) 09/16/2018     The 10-year ASCVD risk score Mikey Bussing DC Jr., et al., 2013) is: 9.6%   ---------------------------------------------------------------------------------------------------      Medications: Outpatient Medications Prior to Visit  Medication Sig  . diltiazem (DILT-XR) 120 MG 24 hr capsule Take 1 capsule (120 mg total) by mouth daily.  . Multiple Vitamins-Minerals (CENTRUM SILVER 50+WOMEN PO) Take by mouth daily.   . naproxen  sodium (ALEVE) 220 MG tablet Take 220 mg by mouth.   No facility-administered medications prior to visit.    Review of Systems  Constitutional: Negative.   HENT: Negative.   Eyes: Negative.   Respiratory: Negative.   Cardiovascular: Positive for leg swelling ("ankle Swelling").  Gastrointestinal: Negative.   Endocrine: Negative.   Genitourinary: Negative.   Musculoskeletal: Negative.   Skin: Negative.   Allergic/Immunologic: Negative.   Neurological: Positive for light-headedness.  Hematological: Negative.   Psychiatric/Behavioral: Negative.        Objective    BP 137/86 (BP Location: Left Arm, Patient Position: Sitting, Cuff Size: Large)   Pulse 88   Temp (!) 97 F (36.1 C) (Temporal)   Resp 16   Ht 5\' 11"  (1.803 m)   Wt 232 lb (105.2 kg)   BMI 32.36 kg/m     Physical Exam Vitals reviewed.  Constitutional:      General: She is not in acute distress.    Appearance: Normal appearance. She is well-developed. She is obese. She is not ill-appearing or diaphoretic.  HENT:     Head: Normocephalic and atraumatic.     Right Ear: Tympanic membrane, ear canal and external ear normal.     Left Ear: Tympanic membrane, ear canal and external ear normal.     Nose: Nose normal.     Mouth/Throat:     Mouth: Mucous membranes are moist.     Pharynx: Oropharynx is clear. No oropharyngeal exudate  or posterior oropharyngeal erythema.  Eyes:     General: No scleral icterus.       Right eye: No discharge.        Left eye: No discharge.     Extraocular Movements: Extraocular movements intact.     Conjunctiva/sclera: Conjunctivae normal.     Pupils: Pupils are equal, round, and reactive to light.  Neck:     Thyroid: No thyromegaly.     Vascular: No carotid bruit or JVD.     Trachea: No tracheal deviation.  Cardiovascular:     Rate and Rhythm: Normal rate and regular rhythm.     Pulses: Normal pulses.     Heart sounds: Normal heart sounds. No murmur heard.  No friction rub. No  gallop.   Pulmonary:     Effort: Pulmonary effort is normal. No respiratory distress.     Breath sounds: Normal breath sounds. No wheezing or rales.  Chest:     Chest wall: No tenderness.  Abdominal:     General: Abdomen is flat. Bowel sounds are normal. There is no distension.     Palpations: Abdomen is soft. There is no mass.     Tenderness: There is no abdominal tenderness. There is no guarding or rebound.  Musculoskeletal:        General: No tenderness. Normal range of motion.     Cervical back: Normal range of motion and neck supple.     Right lower leg: Edema (1-2+ edema) present.     Left lower leg: Edema (1-2+ edema) present.  Lymphadenopathy:     Cervical: No cervical adenopathy.  Skin:    General: Skin is warm and dry.     Capillary Refill: Capillary refill takes less than 2 seconds.     Findings: No rash.  Neurological:     General: No focal deficit present.     Mental Status: She is alert and oriented to person, place, and time. Mental status is at baseline.  Psychiatric:        Mood and Affect: Mood normal.        Behavior: Behavior normal.        Thought Content: Thought content normal.        Judgment: Judgment normal.       No results found for any visits on 09/21/19.  Assessment & Plan     1. Essential hypertension Stable. Continue diltiazem XR 120mg  daily. Tolerating well. Cost has increased drastically. We will attempt for tier exception for her. Will check labs as below and f/u pending results. - CBC w/Diff/Platelet - Comprehensive Metabolic Panel (CMET) - HgB A1c - Lipid Panel With LDL/HDL Ratio  2. Class 1 obesity due to excess calories with serious comorbidity and body mass index (BMI) of 32.0 to 32.9 in adult Counseled patient on healthy lifestyle modifications including dieting and exercise.  - CBC w/Diff/Platelet - Comprehensive Metabolic Panel (CMET) - HgB A1c - Lipid Panel With LDL/HDL Ratio  3. Lymphedema Slightly increased edema. Due  to standing while baking.   4. Elevated glucose Will check labs as below and f/u pending results. - HgB A1c   No follow-ups on file.      , PA-C, have reviewed all documentation for this visit. The documentation on 09/22/19 for the exam, diagnosis, procedures, and orders are all accurate and complete.   09/24/19  [facility] [phone] (phone) [phone] (fax)  [facility]

## 2019-09-21 ENCOUNTER — Other Ambulatory Visit: Payer: Self-pay

## 2019-09-21 ENCOUNTER — Encounter: Payer: Self-pay | Admitting: Physician Assistant

## 2019-09-21 ENCOUNTER — Ambulatory Visit (INDEPENDENT_AMBULATORY_CARE_PROVIDER_SITE_OTHER): Payer: Medicare Other | Admitting: Physician Assistant

## 2019-09-21 VITALS — BP 137/86 | HR 88 | Temp 97.0°F | Resp 16 | Ht 71.0 in | Wt 232.0 lb

## 2019-09-21 DIAGNOSIS — I10 Essential (primary) hypertension: Secondary | ICD-10-CM

## 2019-09-21 DIAGNOSIS — R7309 Other abnormal glucose: Secondary | ICD-10-CM

## 2019-09-21 DIAGNOSIS — I890 Lymphedema, not elsewhere classified: Secondary | ICD-10-CM

## 2019-09-21 DIAGNOSIS — E6609 Other obesity due to excess calories: Secondary | ICD-10-CM

## 2019-09-21 DIAGNOSIS — Z6832 Body mass index (BMI) 32.0-32.9, adult: Secondary | ICD-10-CM

## 2019-09-21 NOTE — Patient Instructions (Signed)
Health Maintenance After Age 68 After age 68, you are at a higher risk for certain long-term diseases and infections as well as injuries from falls. Falls are a major cause of broken bones and head injuries in people who are older than age 68. Getting regular preventive care can help to keep you healthy and well. Preventive care includes getting regular testing and making lifestyle changes as recommended by your health care provider. Talk with your health care provider about:  Which screenings and tests you should have. A screening is a test that checks for a disease when you have no symptoms.  A diet and exercise plan that is right for you. What should I know about screenings and tests to prevent falls? Screening and testing are the best ways to find a health problem early. Early diagnosis and treatment give you the best chance of managing medical conditions that are common after age 68. Certain conditions and lifestyle choices may make you more likely to have a fall. Your health care provider may recommend:  Regular vision checks. Poor vision and conditions such as cataracts can make you more likely to have a fall. If you wear glasses, make sure to get your prescription updated if your vision changes.  Medicine review. Work with your health care provider to regularly review all of the medicines you are taking, including over-the-counter medicines. Ask your health care provider about any side effects that may make you more likely to have a fall. Tell your health care provider if any medicines that you take make you feel dizzy or sleepy.  Osteoporosis screening. Osteoporosis is a condition that causes the bones to get weaker. This can make the bones weak and cause them to break more easily.  Blood pressure screening. Blood pressure changes and medicines to control blood pressure can make you feel dizzy.  Strength and balance checks. Your health care provider may recommend certain tests to check your  strength and balance while standing, walking, or changing positions.  Foot health exam. Foot pain and numbness, as well as not wearing proper footwear, can make you more likely to have a fall.  Depression screening. You may be more likely to have a fall if you have a fear of falling, feel emotionally low, or feel unable to do activities that you used to do.  Alcohol use screening. Using too much alcohol can affect your balance and may make you more likely to have a fall. What actions can I take to lower my risk of falls? General instructions  Talk with your health care provider about your risks for falling. Tell your health care provider if: ? You fall. Be sure to tell your health care provider about all falls, even ones that seem minor. ? You feel dizzy, sleepy, or off-balance.  Take over-the-counter and prescription medicines only as told by your health care provider. These include any supplements.  Eat a healthy diet and maintain a healthy weight. A healthy diet includes low-fat dairy products, low-fat (lean) meats, and fiber from whole grains, beans, and lots of fruits and vegetables. Home safety  Remove any tripping hazards, such as rugs, cords, and clutter.  Install safety equipment such as grab bars in bathrooms and safety rails on stairs.  Keep rooms and walkways well-lit. Activity   Follow a regular exercise program to stay fit. This will help you maintain your balance. Ask your health care provider what types of exercise are appropriate for you.  If you need a cane or   walker, use it as recommended by your health care provider.  Wear supportive shoes that have nonskid soles. Lifestyle  Do not drink alcohol if your health care provider tells you not to drink.  If you drink alcohol, limit how much you have: ? 0-1 drink a day for women. ? 0-2 drinks a day for men.  Be aware of how much alcohol is in your drink. In the [place] one drink equals one typical bottle of beer (12  oz), one-half glass of wine (5 oz), or one shot of hard liquor (1 oz).  Do not use any products that contain nicotine or tobacco, such as cigarettes and e-cigarettes. If you need help quitting, ask your health care provider. Summary  Having a healthy lifestyle and getting preventive care can help to protect your health and wellness after age 68.  Screening and testing are the best way to find a health problem early and help you avoid having a fall. Early diagnosis and treatment give you the best chance for managing medical conditions that are more common for people who are older than age 68.  Falls are a major cause of broken bones and head injuries in people who are older than age 68. Take precautions to prevent a fall at home.  Work with your health care provider to learn what changes you can make to improve your health and wellness and to prevent falls. This information is not intended to replace advice given to you by your health care provider. Make sure you discuss any questions you have with your health care provider. Document Revised: 07/17/2018 Document Reviewed: 02/06/2017 Elsevier Patient Education  2020 Elsevier Inc.  

## 2019-09-22 ENCOUNTER — Telehealth: Payer: Self-pay

## 2019-09-22 LAB — CBC WITH DIFFERENTIAL/PLATELET
Basophils Absolute: 0 10*3/uL (ref 0.0–0.2)
Basos: 1 %
EOS (ABSOLUTE): 0.3 10*3/uL (ref 0.0–0.4)
Eos: 7 %
Hematocrit: 34.8 % (ref 34.0–46.6)
Hemoglobin: 12.2 g/dL (ref 11.1–15.9)
Immature Grans (Abs): 0 10*3/uL (ref 0.0–0.1)
Immature Granulocytes: 0 %
Lymphocytes Absolute: 1.4 10*3/uL (ref 0.7–3.1)
Lymphs: 32 %
MCH: 29.1 pg (ref 26.6–33.0)
MCHC: 35.1 g/dL (ref 31.5–35.7)
MCV: 83 fL (ref 79–97)
Monocytes Absolute: 0.4 10*3/uL (ref 0.1–0.9)
Monocytes: 9 %
Neutrophils Absolute: 2.4 10*3/uL (ref 1.4–7.0)
Neutrophils: 51 %
Platelets: 244 10*3/uL (ref 150–450)
RBC: 4.19 x10E6/uL (ref 3.77–5.28)
RDW: 11.8 % (ref 11.7–15.4)
WBC: 4.5 10*3/uL (ref 3.4–10.8)

## 2019-09-22 LAB — HEMOGLOBIN A1C
Est. average glucose Bld gHb Est-mCnc: 120 mg/dL
Hgb A1c MFr Bld: 5.8 % — ABNORMAL HIGH (ref 4.8–5.6)

## 2019-09-22 LAB — COMPREHENSIVE METABOLIC PANEL
ALT: 10 IU/L (ref 0–32)
AST: 18 IU/L (ref 0–40)
Albumin/Globulin Ratio: 1.3 (ref 1.2–2.2)
Albumin: 4.2 g/dL (ref 3.8–4.8)
Alkaline Phosphatase: 151 IU/L — ABNORMAL HIGH (ref 48–121)
BUN/Creatinine Ratio: 8 — ABNORMAL LOW (ref 12–28)
BUN: 7 mg/dL — ABNORMAL LOW (ref 8–27)
Bilirubin Total: 0.4 mg/dL (ref 0.0–1.2)
CO2: 20 mmol/L (ref 20–29)
Calcium: 9 mg/dL (ref 8.7–10.3)
Chloride: 102 mmol/L (ref 96–106)
Creatinine, Ser: 0.91 mg/dL (ref 0.57–1.00)
GFR calc Af Amer: 76 mL/min/{1.73_m2} (ref >59)
GFR calc non Af Amer: 65 mL/min/{1.73_m2} (ref >59)
Globulin, Total: 3.2 g/dL (ref 1.5–4.5)
Glucose: 99 mg/dL (ref 65–99)
Potassium: 3.8 mmol/L (ref 3.5–5.2)
Sodium: 138 mmol/L (ref 134–144)
Total Protein: 7.4 g/dL (ref 6.0–8.5)

## 2019-09-22 LAB — LIPID PANEL WITH LDL/HDL RATIO
Cholesterol, Total: 186 mg/dL (ref 100–199)
HDL: 72 mg/dL (ref >39)
LDL Chol Calc (NIH): 100 mg/dL — ABNORMAL HIGH (ref 0–99)
LDL/HDL Ratio: 1.4 ratio (ref 0.0–3.2)
Triglycerides: 76 mg/dL (ref 0–149)
VLDL Cholesterol Cal: 14 mg/dL (ref 5–40)

## 2019-09-22 NOTE — Telephone Encounter (Signed)
-----   Message from Margaretann Loveless, PA-C sent at 09/22/2019  9:42 AM EDT ----- Blood count is normal. Kidney and liver function is normal and stable. A1c is borderline elevated at 5.8. Cholesterol is normal.

## 2019-09-22 NOTE — Telephone Encounter (Signed)
Called and read patient lab note by Joycelyn Man PA-C written today 09/22/19. Patient verbalized understanding of all information.

## 2019-09-22 NOTE — Telephone Encounter (Signed)
LMTCB if patient calls back OK for PEC nurse to give results 

## 2019-09-25 ENCOUNTER — Other Ambulatory Visit: Payer: Self-pay | Admitting: Physician Assistant

## 2019-09-25 DIAGNOSIS — Z1231 Encounter for screening mammogram for malignant neoplasm of breast: Secondary | ICD-10-CM

## 2019-09-28 ENCOUNTER — Telehealth: Payer: Self-pay

## 2019-09-28 NOTE — Telephone Encounter (Signed)
Copied from CRM [id number]. Topic: General - Other >> Sep 28, 2019  4:04 PM Elliot Gault wrote: Reason for CRM: patient would like to know if PCP received a Tier Exception Form from [facility] [employer] faxed over today, patient would like a follow up call if form was received or not. Please note

## 2019-09-28 NOTE — Telephone Encounter (Signed)
Copied from CRM #[id number]. Topic: General - Other >> Sep 28, 2019  4:04 PM Bell, Tiffany M wrote: Reason for CRM: patient would like to know if PCP received a Tier Exception Form from [employer] [facility] [employer] faxed over today, patient would like a follow up call if form was received or not. Please note 

## 2019-09-29 NOTE — Telephone Encounter (Signed)
We did receive the form. Pt has been advised that we received the form and that Antony Contras is out of the office this week. Pt requested another provider complete the form if possible. Form is in Jenni's box. Please advise. Thanks TNP

## 2019-09-29 NOTE — Telephone Encounter (Signed)
Patient called again to check the status of a Tier Exception Form that needs to be filled out by the doctor.  Please contact patient once this has be taken care of.  CB# [phone]

## 2019-09-30 NOTE — Telephone Encounter (Signed)
I do not see this form in Jenni's or my box.  Anyone seen it?

## 2019-10-01 ENCOUNTER — Ambulatory Visit
Admission: RE | Admit: 2019-10-01 | Discharge: 2019-10-01 | Disposition: A | Discharge status: Home / Self Care | Payer: Medicare Other | Source: Ambulatory Visit | Attending: Physician Assistant | Admitting: Physician Assistant

## 2019-10-01 DIAGNOSIS — Z1231 Encounter for screening mammogram for malignant neoplasm of breast: Secondary | ICD-10-CM | POA: Insufficient documentation

## 2019-10-01 NOTE — Telephone Encounter (Signed)
I couldn't find the form

## 2019-10-02 NOTE — Telephone Encounter (Signed)
Form completed.

## 2019-10-06 ENCOUNTER — Telehealth: Payer: Self-pay

## 2019-10-06 NOTE — Telephone Encounter (Signed)
LMTCB 10/06/2019.  PEC please advise pt of mammogram results below.   Thanks,   -Alick Lecomte  

## 2019-10-06 NOTE — Telephone Encounter (Signed)
-----   Message from Jennifer M Burnette, PA-C sent at 10/06/2019 11:09 AM EDT ----- Normal mammogram. Repeat screening in one year. 

## 2019-10-06 NOTE — Telephone Encounter (Signed)
Patient called, left VM on both listed numbers to call the office.

## 2019-10-07 NOTE — Telephone Encounter (Signed)
Attempted to call pt.  Left vm on mobile number to return call to office to receive test results.

## 2019-10-07 NOTE — Telephone Encounter (Signed)
Pt. Given mammogram results, verbalizes understanding. 

## 2019-10-09 NOTE — Telephone Encounter (Signed)
Completed form was faxed on 10/02/2019. Thanks TNP

## 2019-12-31 ENCOUNTER — Other Ambulatory Visit: Payer: Self-pay | Admitting: Physician Assistant

## 2019-12-31 DIAGNOSIS — I10 Essential (primary) hypertension: Secondary | ICD-10-CM

## 2020-02-26 IMAGING — MG STEREOTACTIC CORE NEEDLE BIOPSY
8 of 11 series · 8 of 27 positions shown · non-contrast
Comparison: Previous exams.
COMPARISON: Previous exams.

Addendum:
CLINICAL DATA: Left breast lower inner quadrant calcifications.

EXAM:
LEFT BREAST STEREOTACTIC CORE NEEDLE BIOPSY

[L (1 of 2)]
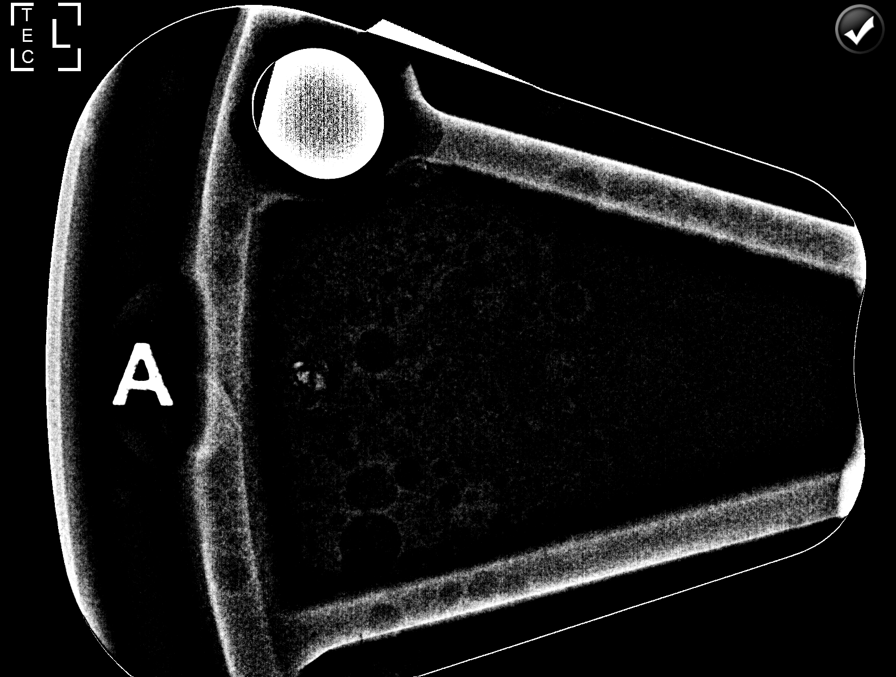

[L (2 of 2)]
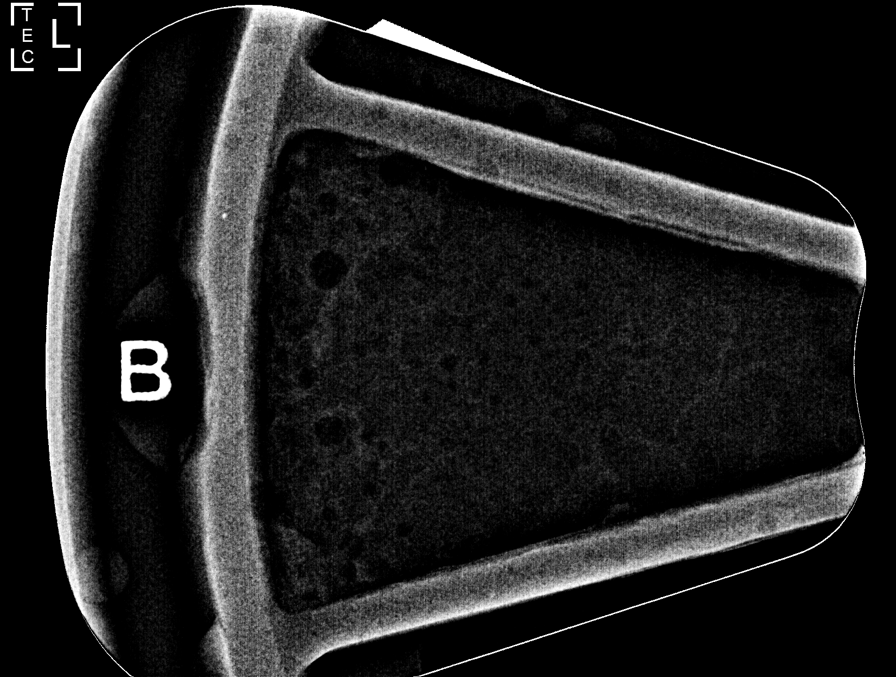

[L ML (1 of 5)]
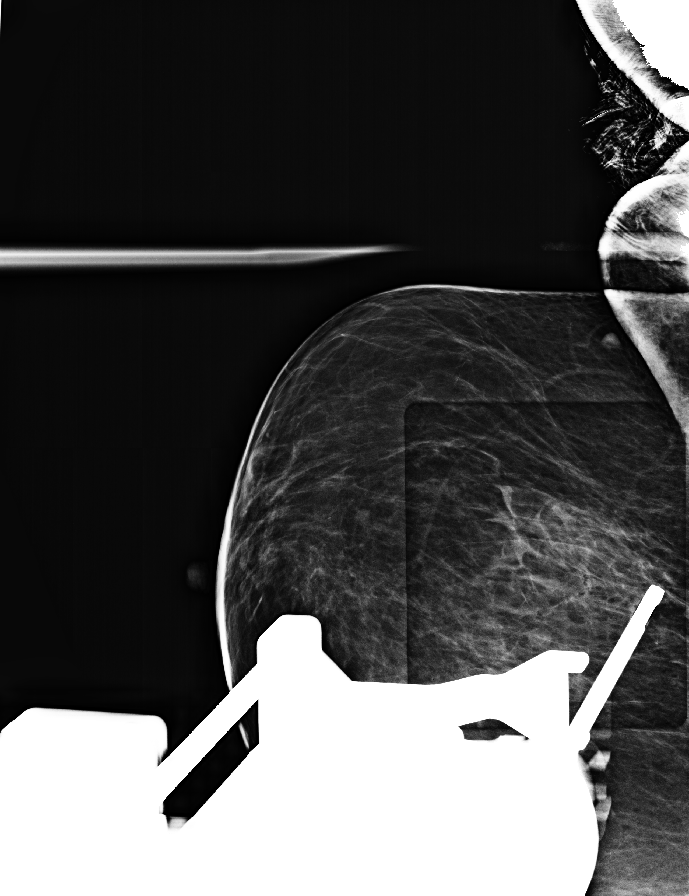

[L ML (2 of 5)]
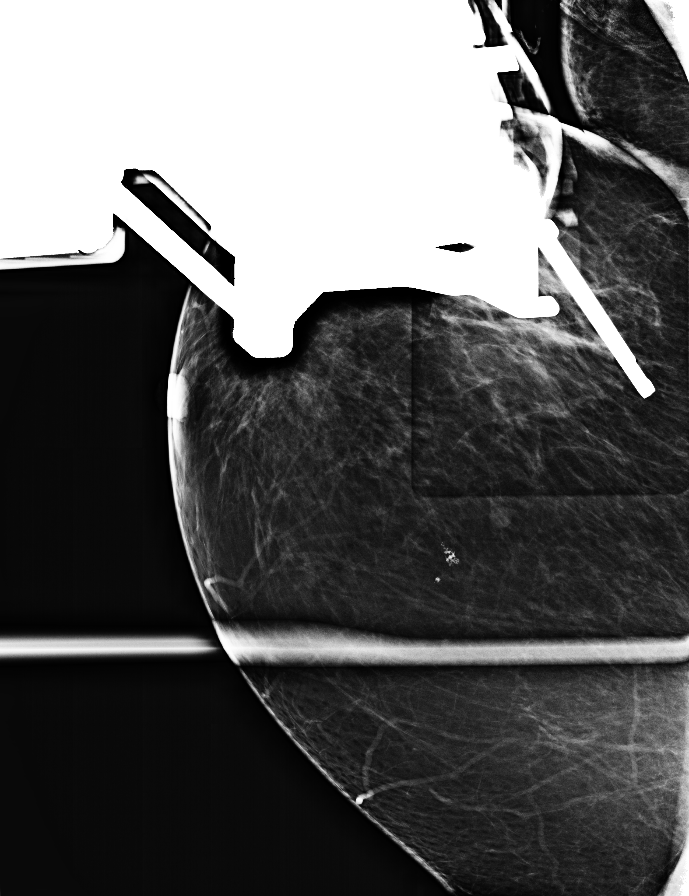

[L ML (3 of 5)]
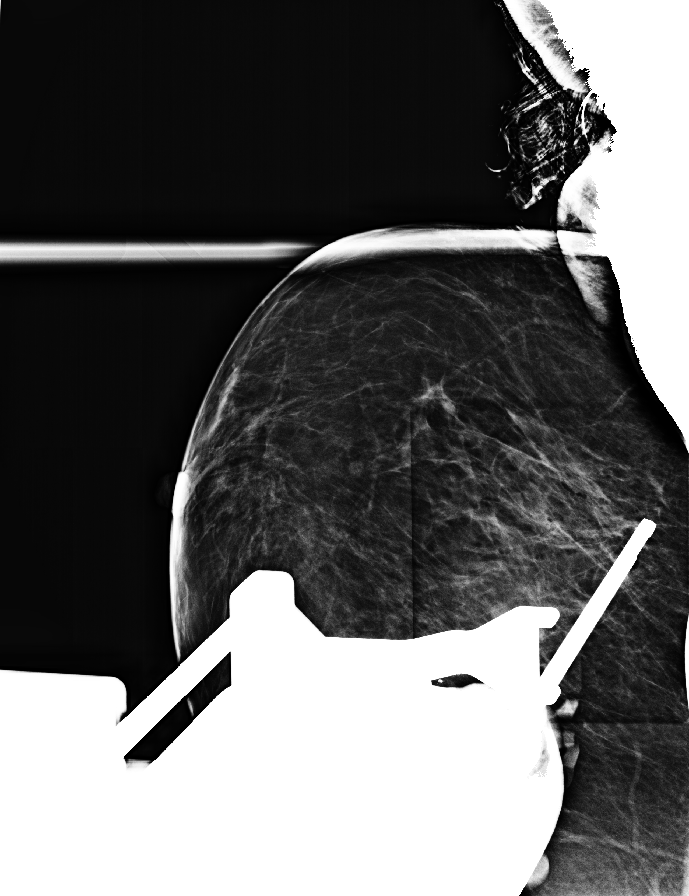

[L ML (4 of 5)]
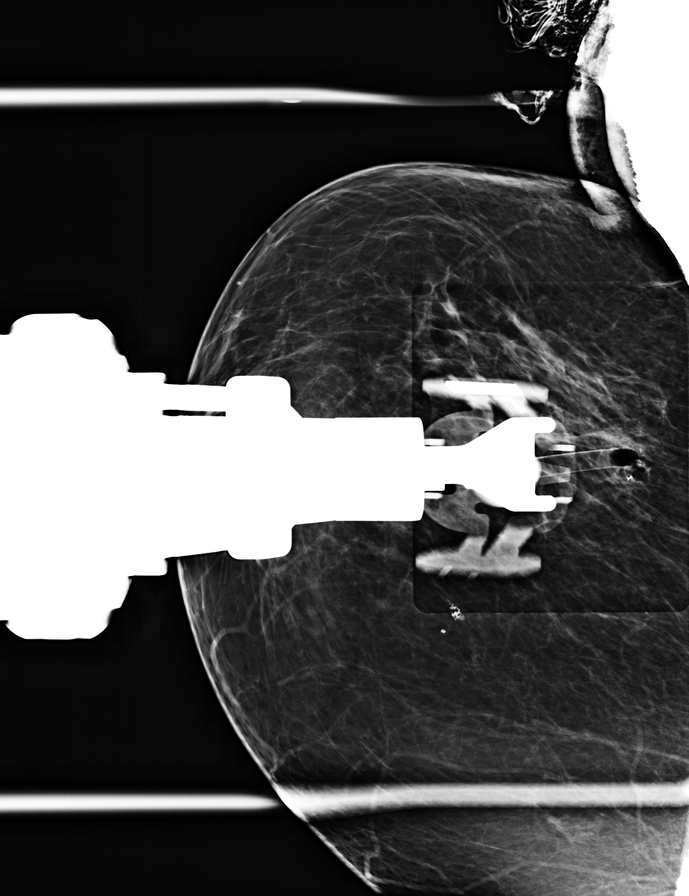

[L ML (5 of 5)]
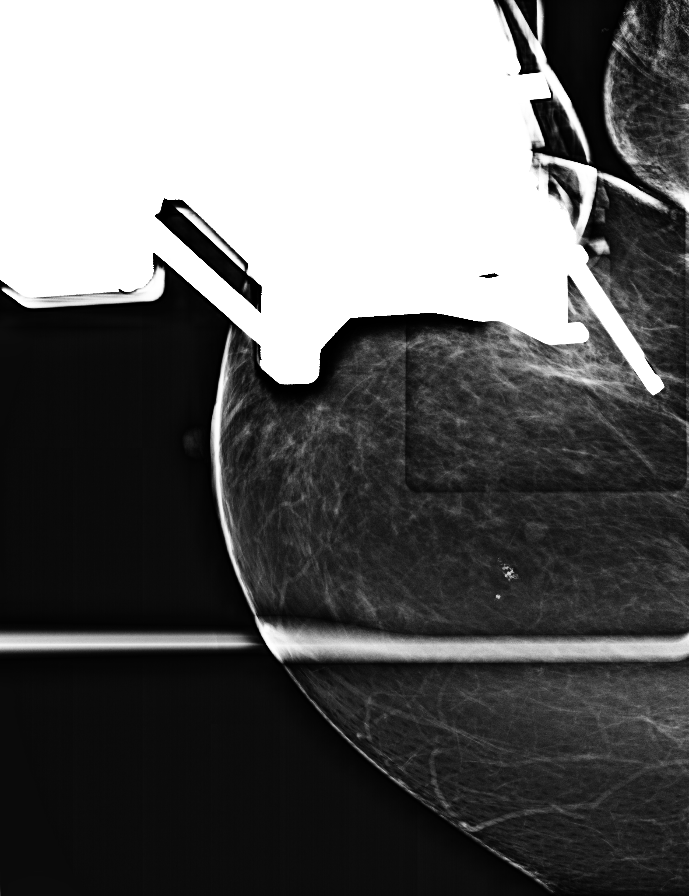

[L ML tomo · tomo slice 39/78.0]
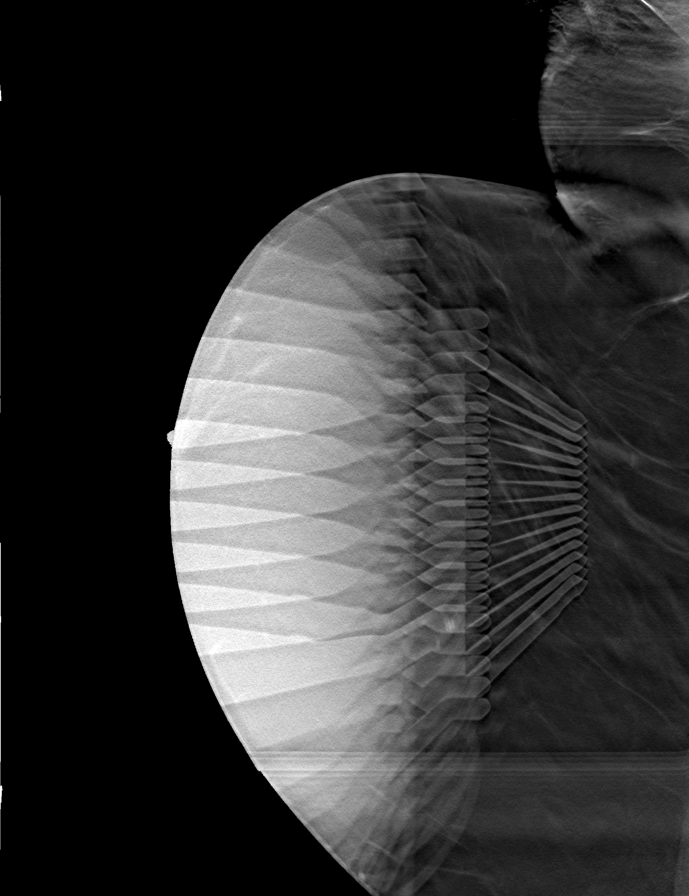

[8 of 27 positions shown; findings below may reference images not displayed]



Using sterile technique and 1% Lidocaine as local anesthetic, under
stereotactic guidance, a 9 gauge vacuum assisted device was used to
perform core needle biopsy of calcifications in the lower inner
quadrant of the left breast using a medial approach. Specimen
radiograph was performed showing presence of calcifications.
Specimens with calcifications are identified for pathology.

Lesion quadrant: Lower inner quadrant

At the conclusion of the procedure, a X shaped tissue marker clip
was deployed into the biopsy cavity. Follow-up 2-view mammogram was
performed and dictated separately.
IMPRESSION: Stereotactic-guided biopsy of left breast. No apparent
complications.

ADDENDUM:
PATHOLOGY: BREAST, LEFT, LOWER INNER QUADRANT; STEREOTACTIC-GUIDED
CORE BIOPSY revealed FIBROADENOMATOUS CHANGES WITH STROMAL
HYALINIZATION AND CALCIFICATIONS. NEGATIVE FOR ATYPIA AND
MALIGNANCY.

CONCORDANT: YES by Dr. Joel Elmer.

I telephoned the patient on 10/03/2018 at 5262 and discussed these
results and the recommendations stated below. All questions were
answered. The patient denies significant pain or bleeding from the
biopsy site. Biopsy site care instructions were reviewed and the
patient was asked to call [HOSPITAL] with any questions
or issues related to the biopsy.

RECOMMENDATION: Bilateral screening mammogram in one year.

Addendum by Martin Augusto Mq RN on 10/03/2018.



Using sterile technique and 1% Lidocaine as local anesthetic, under
stereotactic guidance, a 9 gauge vacuum assisted device was used to
perform core needle biopsy of calcifications in the lower inner
quadrant of the left breast using a medial approach. Specimen
radiograph was performed showing presence of calcifications.
Specimens with calcifications are identified for pathology.

Lesion quadrant: Lower inner quadrant

At the conclusion of the procedure, a X shaped tissue marker clip
was deployed into the biopsy cavity. Follow-up 2-view mammogram was
performed and dictated separately.
IMPRESSION: Stereotactic-guided biopsy of left breast. No apparent
complications.

## 2020-03-18 NOTE — Progress Notes (Signed)
Established patient visit   Patient: Darlene Alvarez   DOB: 19-Jul-1950   69 y.o. Female  MRN: [id number] Visit Date: 03/21/2020  Today's healthcare provider: Margaretann Loveless, PA-C   Chief Complaint  Patient presents with   Hypertension   Subjective    HPI  Hypertension, follow-up  BP Readings from Last 3 Encounters:  03/21/20 (!) 143/79  09/21/19 137/86  06/08/19 136/79   Wt Readings from Last 3 Encounters:  03/21/20 227 lb (103 kg)  09/21/19 232 lb (105.2 kg)  06/08/19 228 lb (103.4 kg)     She was last seen for hypertension 6 months ago.  BP at that visit was 137/86. Management since that visit includes Continue diltiazem XR 120mg  daily. Tolerating well.  We will attempt for tier exception for her.  She reports excellent compliance with treatment. She is not having side effects.  She is following a Regular diet. She is not exercising. She does not smoke.  Use of agents associated with hypertension: none.   Outside blood pressures are 120's/80s. Symptoms: No chest pain No chest pressure  No palpitations No syncope  No dyspnea No orthopnea  No paroxysmal nocturnal dyspnea No lower extremity edema   Pertinent labs: Lab Results  Component Value Date   CHOL 186 09/21/2019   HDL 72 09/21/2019   LDLCALC 100 (H) 09/21/2019   TRIG 76 09/21/2019   CHOLHDL 2.6 09/16/2018   Lab Results  Component Value Date   NA 138 09/21/2019   K 3.8 09/21/2019   CREATININE 0.91 09/21/2019   GFRNONAA 65 09/21/2019   GFRAA 76 09/21/2019   GLUCOSE 99 09/21/2019     The 10-year ASCVD risk score 09/23/2019 DC Jr., et al., 2013) is: 11.8%   ---------------------------------------------------------------------------------------------------  Patient Active Problem List   Diagnosis Date Noted   Class 1 obesity due to excess calories with serious comorbidity and body mass index (BMI) of 31.0 to 31.9 in adult 09/15/2018   Rash 09/27/2017   Essential hypertension  05/12/2017   Lymphedema 05/12/2017   Past Medical History:  Diagnosis Date   Allergy    Hypertension    Social History   Tobacco Use   Smoking status: Never Smoker   Smokeless tobacco: Never Used  Vaping Use   Vaping Use: Never used  Substance Use Topics   Alcohol use: No   Drug use: No   Allergies  Allergen Reactions   Oxycodone-Acetaminophen Hives   Sulfa Antibiotics Hives     Medications: Outpatient Medications Prior to Visit  Medication Sig   DILT-XR 120 MG 24 hr capsule TAKE 1 CAPSULE BY MOUTH EVERY DAY   Multiple Vitamins-Minerals (CENTRUM SILVER 50+WOMEN PO) Take by mouth daily.    naproxen sodium (ALEVE) 220 MG tablet Take 220 mg by mouth.   No facility-administered medications prior to visit.    Review of Systems  Constitutional: Negative.   Eyes: Negative.   Respiratory: Negative.   Cardiovascular: Negative.   Gastrointestinal: Negative.   Neurological: Positive for light-headedness (rarely). Negative for dizziness and headaches.      Objective    BP (!) 143/79 (BP Location: Left Arm, Patient Position: Sitting, Cuff Size: Large)    Pulse 71    Temp 98 F (36.7 C) (Oral)    Wt 227 lb (103 kg)    BMI 31.66 kg/m    Physical Exam Vitals reviewed.  Constitutional:      General: She is not in acute distress.    Appearance: Normal  appearance. She is well-developed and well-nourished. She is obese. She is not ill-appearing or diaphoretic.  Cardiovascular:     Rate and Rhythm: Normal rate and regular rhythm.     Pulses: Normal pulses.     Heart sounds: Normal heart sounds. No murmur heard. No friction rub. No gallop.   Pulmonary:     Effort: Pulmonary effort is normal. No respiratory distress.     Breath sounds: Normal breath sounds. No wheezing or rales.  Musculoskeletal:     Cervical back: Normal range of motion and neck supple.  Neurological:     Mental Status: She is alert.  Psychiatric:        Mood and Affect: Mood normal.         Thought Content: Thought content normal.      No results found for any visits on 03/21/20.  Assessment & Plan     1. Eyelid lesion, benign Noted on right eyelid. She prefers evaluation and possible removal. Referral to [place] [facility] placed - Ambulatory referral to Ophthalmology  2. Essential hypertension Stable. Diagnosis pulled for medication refill. Continue current medical treatment plan. F/U in 6 months for CPE/AWV. - diltiazem (DILT-XR) 120 MG 24 hr capsule; TAKE 1 CAPSULE BY MOUTH EVERY DAY  Dispense: 90 capsule; Refill: 3   No follow-ups on file.      Delmer Islam, PA-C, have reviewed all documentation for this visit. The documentation on 03/21/20 for the exam, diagnosis, procedures, and orders are all accurate and complete.   Reine Just  [facility] [phone] (phone) [phone] (fax)  [facility]

## 2020-03-21 ENCOUNTER — Other Ambulatory Visit: Payer: Self-pay

## 2020-03-21 ENCOUNTER — Encounter: Payer: Self-pay | Admitting: Physician Assistant

## 2020-03-21 ENCOUNTER — Ambulatory Visit (INDEPENDENT_AMBULATORY_CARE_PROVIDER_SITE_OTHER): Payer: Medicare Other | Admitting: Physician Assistant

## 2020-03-21 VITALS — BP 143/79 | HR 71 | Temp 98.0°F | Wt 227.0 lb

## 2020-03-21 DIAGNOSIS — H029 Unspecified disorder of eyelid: Secondary | ICD-10-CM | POA: Diagnosis not present

## 2020-03-21 DIAGNOSIS — I10 Essential (primary) hypertension: Secondary | ICD-10-CM | POA: Diagnosis not present

## 2020-03-21 MED ORDER — DILTIAZEM HCL ER 120 MG PO CP24: ORAL_CAPSULE | ORAL | 3 refills | Status: DC

## 2020-03-24 ENCOUNTER — Ambulatory Visit: Payer: Self-pay | Admitting: Physician Assistant

## 2020-06-07 ENCOUNTER — Telehealth: Payer: Self-pay

## 2020-06-07 NOTE — Telephone Encounter (Signed)
Copied from CRM [id number]. Topic: Quick Communication - Rx Refill/Question >> Jun 07, 2020  2:39 PM Crist Infante wrote: Medication: diltiazem (DILT-XR) 120 MG 24 hr capsule  Has the patient contacted their pharmacy? yes Pt wants to know if you received the Tier Exception for this medication?  She needs to pick up soon. Please call to advise Pt states Boneta Lucks did this last year for her.

## 2020-06-09 NOTE — Telephone Encounter (Signed)
YesAntony Alvarez patients can be scheduled with any provider for their next CPE/regular f/u as requested. No need to send messages for my approval.  Thanks!   Copied from CRM [id number]. Topic: Appointment Scheduling - Scheduling Inquiry for Clinic >> Jun 07, 2020  2:48 PM Darlene Alvarez wrote: Reason for CRM: pt wants to know if Dr B will accept her and her husband Darlene Alvarez.  (03/25/1953)

## 2020-06-10 ENCOUNTER — Telehealth: Payer: Self-pay

## 2020-06-10 NOTE — Telephone Encounter (Signed)
Left message to call back to schedule. OK for PEC to schedule appts w/Dr B.

## 2020-06-10 NOTE — Telephone Encounter (Signed)
Copied from CRM [id number]. Topic: General - Other >> Jun 10, 2020  2:09 PM Gwenlyn Fudge wrote: Reason for CRM: Pt called and is requesting to have a nurse give her a call back regarding the Tier Paperwork that [facility] sent over for pt on 06/06/20. Please advise.

## 2020-06-13 NOTE — Telephone Encounter (Signed)
Pt called and is requesting to speak with nurse. Please advise,

## 2020-06-14 NOTE — Telephone Encounter (Signed)
Do you have her paperwork?  Thanks,   -Vernona Rieger

## 2020-06-15 NOTE — Telephone Encounter (Signed)
Patient has made additional contact regarding their tier exception paperwork Patient would like to be notified when form is completed Please contact to advise at [phone]

## 2020-06-15 NOTE — Telephone Encounter (Signed)
Please advise 

## 2020-06-15 NOTE — Telephone Encounter (Signed)
Pt called and reported that she has not yet received feedback from the office. She wants this done before her PCP leaves.  Best contact: [phone]

## 2020-06-16 NOTE — Telephone Encounter (Signed)
Pt stated she urgently needs this to be resolved, requested a call back from [place] or her nurse.

## 2020-06-17 NOTE — Telephone Encounter (Signed)
Forms completed

## 2020-06-17 NOTE — Telephone Encounter (Addendum)
Pt is calling in to follow up on assistance with getting form completed for medication. Advised pt per below that PCP has completed form. Pt is very grateful.

## 2020-08-30 ENCOUNTER — Telehealth: Payer: Self-pay | Admitting: Physician Assistant

## 2020-08-30 DIAGNOSIS — Z1231 Encounter for screening mammogram for malignant neoplasm of breast: Secondary | ICD-10-CM

## 2020-08-30 NOTE — Telephone Encounter (Signed)
It this okay to order?  Thanks,   -Vernona Rieger

## 2020-08-30 NOTE — Telephone Encounter (Signed)
Patient is calling to get a referral for a mammogram.  She is a former patient of Dr. Rosezetta Schlatter.  Patient would like to go to the [facility].  Please call to confirm at [phone]

## 2020-08-30 NOTE — Telephone Encounter (Signed)
Ok to order 

## 2020-08-30 NOTE — Telephone Encounter (Signed)
Order placed; pt advised.   Thanks,   -Sherrin Stahle  

## 2020-10-03 ENCOUNTER — Ambulatory Visit
Admission: RE | Admit: 2020-10-03 | Discharge: 2020-10-03 | Disposition: A | Discharge status: Home / Self Care | Payer: Medicare Other | Source: Ambulatory Visit | Attending: Family Medicine | Admitting: Family Medicine

## 2020-10-03 ENCOUNTER — Other Ambulatory Visit: Payer: Self-pay

## 2020-10-03 DIAGNOSIS — Z1231 Encounter for screening mammogram for malignant neoplasm of breast: Secondary | ICD-10-CM | POA: Insufficient documentation

## 2020-10-24 ENCOUNTER — Ambulatory Visit (INDEPENDENT_AMBULATORY_CARE_PROVIDER_SITE_OTHER): Payer: Medicare Other | Admitting: Family Medicine

## 2020-10-24 ENCOUNTER — Encounter: Payer: Self-pay | Admitting: Family Medicine

## 2020-10-24 ENCOUNTER — Other Ambulatory Visit: Payer: Self-pay

## 2020-10-24 VITALS — BP 157/80 | HR 75 | Temp 98.0°F | Resp 16 | Wt 224.2 lb

## 2020-10-24 DIAGNOSIS — I10 Essential (primary) hypertension: Secondary | ICD-10-CM

## 2020-10-24 DIAGNOSIS — Z6831 Body mass index (BMI) 31.0-31.9, adult: Secondary | ICD-10-CM

## 2020-10-24 DIAGNOSIS — I890 Lymphedema, not elsewhere classified: Secondary | ICD-10-CM | POA: Diagnosis not present

## 2020-10-24 DIAGNOSIS — F4321 Adjustment disorder with depressed mood: Secondary | ICD-10-CM

## 2020-10-24 DIAGNOSIS — E6609 Other obesity due to excess calories: Secondary | ICD-10-CM

## 2020-10-24 DIAGNOSIS — R7303 Prediabetes: Secondary | ICD-10-CM | POA: Diagnosis not present

## 2020-10-24 NOTE — Assessment & Plan Note (Signed)
Recommend low carb diet °Recheck A1c  °

## 2020-10-24 NOTE — Progress Notes (Signed)
Established Patient Visit     Patient: Darlene Alvarez, Female    DOB: 12-01-1950, 70 y.o.   MRN: [id number] Visit Date: 10/24/2020  Today's Provider: Shirlee Latch, MD   Chief Complaint  Patient presents with   Hypertension   Subjective    Darlene Alvarez is a 70 y.o. female who presents today for her f/u visit  Hypertension Pertinent negatives include no chest pain or shortness of breath.  - Stable on diltiazem 120 mg daily - Systolic BP elevated in office today (157/80, 142/80 on manual recheck), which pt. attributes to grief & stress  Complicated Grief - Experienced the loss of several family members during recent months - Pt. States that she is coping well, relying on spiritual community/friends  Health Maintenance - UTD on colonoscopy, mammo, bone density scan - Due for shingles & COVID vaccines; pt. prefers to defer vaccines due to prior allergic reaction to flu shot - Due for annual screening labs  Medications: Outpatient Medications Prior to Visit  Medication Sig   diltiazem (DILT-XR) 120 MG 24 hr capsule TAKE 1 CAPSULE BY MOUTH EVERY DAY   Multiple Vitamins-Minerals (CENTRUM SILVER 50+WOMEN PO) Take by mouth daily.    naproxen sodium (ALEVE) 220 MG tablet Take 220 mg by mouth.   No facility-administered medications prior to visit.    Allergies  Allergen Reactions   Oxycodone-Acetaminophen Hives   Sulfa Antibiotics Hives    Patient Care Team: Jacky Kindle, FNP as PCP - General (Family Medicine)  Review of Systems  Constitutional:  Negative for activity change and appetite change.  Respiratory:  Negative for shortness of breath.   Cardiovascular:  Positive for leg swelling (history of chronic lymphedema). Negative for chest pain.  Musculoskeletal:  Positive for arthralgias (arthritis in digits bilaterally).  All other systems reviewed and are negative.      Objective    Vitals: BP (!) 157/80 (BP Location: Left Arm, Patient  Position: Sitting, Cuff Size: Large)   Pulse 75   Temp 98 F (36.7 C) (Oral)   Resp 16   Wt 224 lb 3.2 oz (101.7 kg)   SpO2 100%   BMI 31.27 kg/m     Physical Exam Constitutional:      General: She is not in acute distress.    Appearance: Normal appearance. She is normal weight.  HENT:     Head: Normocephalic and atraumatic.     Right Ear: External ear normal.     Left Ear: External ear normal.  Eyes:     Conjunctiva/sclera: Conjunctivae normal.  Cardiovascular:     Rate and Rhythm: Normal rate and regular rhythm.     Pulses: Normal pulses.     Heart sounds: Normal heart sounds.  Pulmonary:     Effort: Pulmonary effort is normal.     Breath sounds: Normal breath sounds.  Abdominal:     General: Abdomen is flat. Bowel sounds are normal.     Palpations: Abdomen is soft.  Skin:    Findings: Lesion (epidermal cyst inner L calf & actinic keratosis on inner L thigh) present.  Neurological:     Mental Status: She is alert.    Most recent functional status assessment: In your present state of health, do you have any difficulty performing the following activities: 10/24/2020  Hearing? N  Vision? Y  Difficulty concentrating or making decisions? N  Walking or climbing stairs? N  Dressing or bathing? N  Doing errands, shopping? N  Some recent data  might be hidden   Most recent fall risk assessment: Fall Risk  10/24/2020  Falls in the past year? 0  Number falls in past yr: 0  Injury with Fall? 0  Risk for fall due to : No Fall Risks  Follow up Falls evaluation completed    Most recent depression screenings: PHQ 2/9 Scores 10/24/2020 03/21/2020  PHQ - 2 Score 0 0  PHQ- 9 Score 0 0   Most recent cognitive screening: 6CIT Screen 08/05/2019  What Year? 0 points  What month? 0 points  What time? 0 points  Count back from 20 0 points  Months in reverse 0 points  Repeat phrase 0 points  Total Score 0   Most recent Audit-C alcohol use screening Alcohol Use Disorder Test  (AUDIT) 10/24/2020  1. How often do you have a drink containing alcohol? 0  2. How many drinks containing alcohol do you have on a typical day when you are drinking? 0  3. How often do you have six or more drinks on one occasion? 0  AUDIT-C Score 0  Alcohol Brief Interventions/Follow-up -   A score of 3 or more in women, and 4 or more in men indicates increased risk for alcohol abuse, EXCEPT if all of the points are from question 1   No results found for any visits on 10/24/20.  Assessment & Plan     Hypertension - Stable, continue diltiazem 120 mg daily - Encouraged pt. to regularly check BP at home  - RTC in 3 mos for HTN follow-up  Health Maintenance - Will defer COVID & shingles vaccines at this time, given pt.'s h/o rxn to flu shot - CMP, lipids, HbA1c today  Problem List Items Addressed This Visit       Cardiovascular and Mediastinum   Essential hypertension - Primary    - Stable, continue diltiazem 120 mg daily - Encouraged pt. to regularly check BP at home  - RTC in 3 mos for HTN follow-up       Relevant Orders   Comprehensive metabolic panel     Other   Lymphedema    Chronic and stable       Class 1 obesity due to excess calories with serious comorbidity and body mass index (BMI) of 31.0 to 31.9 in adult    Discussed importance of healthy weight management Discussed diet and exercise        Relevant Orders   Comprehensive metabolic panel   Lipid panel   Hemoglobin A1c   Prediabetes    Recommend low carb diet Recheck A1c        Relevant Orders   Hemoglobin A1c   Grief    Condolences given         Return in about 3 months (around 01/24/2021) for chronic disease f/u.     Queen Blossom, MS3  Patient seen along with MS3 student Queen Blossom. I personally evaluated this patient along with the student, and verified all aspects of the history, physical exam, and medical decision making as documented by the student. I agree with the student's  documentation and have made all necessary edits.  Algie Westry, Marzella Schlein, MD, MPH [facility]

## 2020-10-24 NOTE — Assessment & Plan Note (Signed)
Chronic and stable.   

## 2020-10-24 NOTE — Assessment & Plan Note (Signed)
Discussed importance of healthy weight management Discussed diet and exercise  

## 2020-10-24 NOTE — Assessment & Plan Note (Signed)
Condolences given.

## 2020-10-24 NOTE — Assessment & Plan Note (Signed)
-   Stable, continue diltiazem 120 mg daily - Encouraged pt. to regularly check BP at home  - RTC in 3 mos for HTN follow-up

## 2020-10-25 LAB — COMPREHENSIVE METABOLIC PANEL
ALT: 10 IU/L (ref 0–32)
AST: 14 IU/L (ref 0–40)
Albumin/Globulin Ratio: 1.3 (ref 1.2–2.2)
Albumin: 4 g/dL (ref 3.8–4.8)
Alkaline Phosphatase: 138 IU/L — ABNORMAL HIGH (ref 44–121)
BUN/Creatinine Ratio: 13 (ref 12–28)
BUN: 11 mg/dL (ref 8–27)
Bilirubin Total: 0.3 mg/dL (ref 0.0–1.2)
CO2: 23 mmol/L (ref 20–29)
Calcium: 9.1 mg/dL (ref 8.7–10.3)
Chloride: 106 mmol/L (ref 96–106)
Creatinine, Ser: 0.86 mg/dL (ref 0.57–1.00)
Globulin, Total: 3.1 g/dL (ref 1.5–4.5)
Glucose: 96 mg/dL (ref 65–99)
Potassium: 4.2 mmol/L (ref 3.5–5.2)
Sodium: 141 mmol/L (ref 134–144)
Total Protein: 7.1 g/dL (ref 6.0–8.5)
eGFR: 74 mL/min/{1.73_m2} (ref >59)

## 2020-10-25 LAB — LIPID PANEL
Chol/HDL Ratio: 2.6 ratio (ref 0.0–4.4)
Cholesterol, Total: 164 mg/dL (ref 100–199)
HDL: 62 mg/dL (ref >39)
LDL Chol Calc (NIH): 91 mg/dL (ref 0–99)
Triglycerides: 57 mg/dL (ref 0–149)
VLDL Cholesterol Cal: 11 mg/dL (ref 5–40)

## 2020-10-25 LAB — HEMOGLOBIN A1C
Est. average glucose Bld gHb Est-mCnc: 117 mg/dL
Hgb A1c MFr Bld: 5.7 % — ABNORMAL HIGH (ref 4.8–5.6)

## 2020-11-28 DIAGNOSIS — H6121 Impacted cerumen, right ear: Secondary | ICD-10-CM | POA: Diagnosis not present

## 2021-03-18 ENCOUNTER — Other Ambulatory Visit: Payer: Self-pay | Admitting: Physician Assistant

## 2021-03-18 DIAGNOSIS — I10 Essential (primary) hypertension: Secondary | ICD-10-CM

## 2021-04-05 DIAGNOSIS — I890 Lymphedema, not elsewhere classified: Secondary | ICD-10-CM | POA: Diagnosis not present

## 2021-04-05 DIAGNOSIS — I10 Essential (primary) hypertension: Secondary | ICD-10-CM | POA: Diagnosis not present

## 2021-04-13 ENCOUNTER — Other Ambulatory Visit: Payer: Self-pay | Admitting: Family Medicine

## 2021-04-13 DIAGNOSIS — I10 Essential (primary) hypertension: Secondary | ICD-10-CM

## 2021-04-13 MED ORDER — DILTIAZEM HCL ER 120 MG PO CP24: ORAL_CAPSULE | ORAL | 1 refills | Status: DC

## 2021-04-14 DIAGNOSIS — I10 Essential (primary) hypertension: Secondary | ICD-10-CM | POA: Diagnosis not present

## 2021-04-14 DIAGNOSIS — R7301 Impaired fasting glucose: Secondary | ICD-10-CM | POA: Diagnosis not present

## 2021-04-14 DIAGNOSIS — R5383 Other fatigue: Secondary | ICD-10-CM | POA: Diagnosis not present

## 2021-04-24 DIAGNOSIS — I10 Essential (primary) hypertension: Secondary | ICD-10-CM | POA: Diagnosis not present

## 2021-04-24 DIAGNOSIS — R7303 Prediabetes: Secondary | ICD-10-CM | POA: Diagnosis not present

## 2021-04-24 DIAGNOSIS — I890 Lymphedema, not elsewhere classified: Secondary | ICD-10-CM | POA: Diagnosis not present

## 2021-06-07 DIAGNOSIS — Z86010 Personal history of colonic polyps: Secondary | ICD-10-CM | POA: Diagnosis not present

## 2021-07-10 DIAGNOSIS — K5730 Diverticulosis of large intestine without perforation or abscess without bleeding: Secondary | ICD-10-CM | POA: Diagnosis not present

## 2021-07-10 DIAGNOSIS — Z86010 Personal history of colonic polyps: Secondary | ICD-10-CM | POA: Diagnosis not present

## 2021-07-10 DIAGNOSIS — K640 First degree hemorrhoids: Secondary | ICD-10-CM | POA: Diagnosis not present

## 2021-07-10 DIAGNOSIS — Q438 Other specified congenital malformations of intestine: Secondary | ICD-10-CM | POA: Diagnosis not present

## 2021-07-14 ENCOUNTER — Telehealth: Payer: Self-pay | Admitting: *Deleted

## 2021-07-14 NOTE — Chronic Care Management (AMB) (Signed)
Patient PCP is at [facility]  ? ?Laverda Sorenson  ?Care Guide, Embedded Care Coordination ?[facility]  ?Direct Dial: [phone] ? ?

## 2021-08-28 DIAGNOSIS — I10 Essential (primary) hypertension: Secondary | ICD-10-CM | POA: Diagnosis not present

## 2021-08-28 DIAGNOSIS — E669 Obesity, unspecified: Secondary | ICD-10-CM | POA: Diagnosis not present

## 2021-08-28 DIAGNOSIS — R7303 Prediabetes: Secondary | ICD-10-CM | POA: Diagnosis not present

## 2021-08-30 ENCOUNTER — Other Ambulatory Visit: Payer: Self-pay | Admitting: Nurse Practitioner

## 2021-08-30 DIAGNOSIS — R7303 Prediabetes: Secondary | ICD-10-CM | POA: Diagnosis not present

## 2021-08-30 DIAGNOSIS — I10 Essential (primary) hypertension: Secondary | ICD-10-CM | POA: Diagnosis not present

## 2021-08-30 DIAGNOSIS — I890 Lymphedema, not elsewhere classified: Secondary | ICD-10-CM | POA: Diagnosis not present

## 2021-08-30 DIAGNOSIS — Z1231 Encounter for screening mammogram for malignant neoplasm of breast: Secondary | ICD-10-CM

## 2021-10-04 ENCOUNTER — Ambulatory Visit
Admission: RE | Admit: 2021-10-04 | Discharge: 2021-10-04 | Disposition: A | Discharge status: Home / Self Care | Payer: Medicare Other | Source: Ambulatory Visit | Attending: Nurse Practitioner | Admitting: Nurse Practitioner

## 2021-10-04 DIAGNOSIS — Z1231 Encounter for screening mammogram for malignant neoplasm of breast: Secondary | ICD-10-CM | POA: Insufficient documentation

## 2022-01-02 DIAGNOSIS — R7303 Prediabetes: Secondary | ICD-10-CM | POA: Diagnosis not present

## 2022-01-02 DIAGNOSIS — I10 Essential (primary) hypertension: Secondary | ICD-10-CM | POA: Diagnosis not present

## 2022-01-03 DIAGNOSIS — R7303 Prediabetes: Secondary | ICD-10-CM | POA: Diagnosis not present

## 2022-01-03 DIAGNOSIS — L03119 Cellulitis of unspecified part of limb: Secondary | ICD-10-CM | POA: Diagnosis not present

## 2022-01-03 DIAGNOSIS — I10 Essential (primary) hypertension: Secondary | ICD-10-CM | POA: Diagnosis not present

## 2022-01-03 DIAGNOSIS — R5383 Other fatigue: Secondary | ICD-10-CM | POA: Diagnosis not present

## 2022-05-29 ENCOUNTER — Other Ambulatory Visit: Payer: Self-pay | Admitting: Nurse Practitioner

## 2022-08-27 ENCOUNTER — Other Ambulatory Visit: Payer: Self-pay | Admitting: Nurse Practitioner

## 2022-08-27 DIAGNOSIS — Z1231 Encounter for screening mammogram for malignant neoplasm of breast: Secondary | ICD-10-CM

## 2022-09-11 DIAGNOSIS — S90822A Blister (nonthermal), left foot, initial encounter: Secondary | ICD-10-CM | POA: Diagnosis not present

## 2022-09-11 DIAGNOSIS — L03116 Cellulitis of left lower limb: Secondary | ICD-10-CM | POA: Diagnosis not present

## 2022-09-11 DIAGNOSIS — L089 Local infection of the skin and subcutaneous tissue, unspecified: Secondary | ICD-10-CM | POA: Diagnosis not present

## 2022-09-11 DIAGNOSIS — I890 Lymphedema, not elsewhere classified: Secondary | ICD-10-CM | POA: Diagnosis not present

## 2022-10-08 ENCOUNTER — Ambulatory Visit
Admission: RE | Admit: 2022-10-08 | Discharge: 2022-10-08 | Disposition: A | Discharge status: Home / Self Care | Payer: Medicare Other | Source: Ambulatory Visit | Attending: Nurse Practitioner | Admitting: Nurse Practitioner

## 2022-10-08 DIAGNOSIS — Z1231 Encounter for screening mammogram for malignant neoplasm of breast: Secondary | ICD-10-CM | POA: Insufficient documentation

## 2022-10-12 ENCOUNTER — Other Ambulatory Visit: Payer: Self-pay | Admitting: Nurse Practitioner

## 2022-10-12 DIAGNOSIS — I10 Essential (primary) hypertension: Secondary | ICD-10-CM

## 2023-04-04 DIAGNOSIS — H6121 Impacted cerumen, right ear: Secondary | ICD-10-CM | POA: Diagnosis not present

## 2023-06-08 HISTORY — PX: CHOLECYSTECTOMY: SHX55

## 2023-06-09 DIAGNOSIS — K810 Acute cholecystitis: Secondary | ICD-10-CM | POA: Insufficient documentation

## 2023-09-04 ENCOUNTER — Encounter: Payer: Self-pay | Admitting: Nurse Practitioner

## 2023-09-04 ENCOUNTER — Other Ambulatory Visit: Payer: Self-pay | Admitting: Family

## 2023-09-04 DIAGNOSIS — Z1231 Encounter for screening mammogram for malignant neoplasm of breast: Secondary | ICD-10-CM

## 2023-09-19 ENCOUNTER — Ambulatory Visit: Payer: Self-pay | Admitting: Family

## 2023-09-19 ENCOUNTER — Encounter: Payer: Self-pay | Admitting: Family

## 2023-09-19 VITALS — BP 126/84 | HR 66 | Ht 72.0 in | Wt 217.8 lb

## 2023-09-19 DIAGNOSIS — R7303 Prediabetes: Secondary | ICD-10-CM | POA: Diagnosis not present

## 2023-09-19 DIAGNOSIS — E559 Vitamin D deficiency, unspecified: Secondary | ICD-10-CM

## 2023-09-19 DIAGNOSIS — I10 Essential (primary) hypertension: Secondary | ICD-10-CM

## 2023-09-19 DIAGNOSIS — I890 Lymphedema, not elsewhere classified: Secondary | ICD-10-CM

## 2023-09-19 DIAGNOSIS — E538 Deficiency of other specified B group vitamins: Secondary | ICD-10-CM

## 2023-09-19 DIAGNOSIS — E782 Mixed hyperlipidemia: Secondary | ICD-10-CM

## 2023-09-19 DIAGNOSIS — R5383 Other fatigue: Secondary | ICD-10-CM

## 2023-09-19 NOTE — Progress Notes (Signed)
 Established Patient Office Visit  Subjective:  Patient ID: Darlene Alvarez, female    DOB: [birth year]  Age: 73 y.o. MRN: [id number]  Chief Complaint  Patient presents with   Establish Care    Re-establish care, used to see Chelsa    Patient is here today for her 3 months follow up.  She has been feeling fairly well since last appointment.   She does have additional concerns to discuss today.  She needs some refills and is overdue for labs, since she used to see Chelsa but hasn't been to see a provider in a while.   Labs are due today. She needs refills.   I have reviewed her active problem list, medication list, allergies, notes from last encounter, lab results for her appointment today.      No other concerns at this time.   Past Medical History:  Diagnosis Date   Acute cholecystitis 06/09/2023   Allergy    Class 1 obesity due to excess calories with serious comorbidity and body mass index (BMI) of 31.0 to 31.9 in adult 09/15/2018   Hypertension     Past Surgical History:  Procedure Laterality Date   ABDOMINAL HYSTERECTOMY     BREAST BIOPSY Left 10/01/2018   Affirm Biopsy- X-clip, FIBROADENOMATOUS CHANGES    CESAREAN SECTION     CHOLECYSTECTOMY Bilateral 06/2023    Social History   Socioeconomic History   Marital status: Married    Spouse name: Not on file   Number of children: 1   Years of education: Not on file   Highest education level: Associate degree: occupational, Scientist, product/process development, or vocational program  Occupational History   Occupation: social service    Comment: Programmer, applications.  Tobacco Use   Smoking status: Never   Smokeless tobacco: Never  Vaping Use   Vaping status: Never Used  Substance and Sexual Activity   Alcohol use: No   Drug use: No   Sexual activity: Not on file  Other Topics Concern   Not on file  Social History Narrative   Not on file   Social Drivers of Health   Financial Resource Strain: Low Risk  (08/05/2019)    Overall Financial Resource Strain (CARDIA)    Difficulty of Paying Living Expenses: Not hard at all  Food Insecurity: No Food Insecurity (08/05/2019)   Hunger Vital Sign    Worried About Running Out of Food in the Last Year: Never true    Ran Out of Food in the Last Year: Never true  Transportation Needs: No Transportation Needs (08/05/2019)   PRAPARE - Administrator, Civil Service (Medical): No    Lack of Transportation (Non-Medical): No  Physical Activity: Inactive (08/05/2019)   Exercise Vital Sign    Days of Exercise per Week: 0 days    Minutes of Exercise per Session: 0 min  Stress: No Stress Concern Present (08/05/2019)   [employer] of Occupational Health - Occupational Stress Questionnaire    Feeling of Stress : Not at all  Social Connections: Moderately Integrated (08/05/2019)   Social Connection and Isolation Panel    Frequency of Communication with Friends and Family: More than three times a week    Frequency of Social Gatherings with Friends and Family: More than three times a week    Attends Religious Services: More than 4 times per year    Active Member of [employer] or Organizations: No    Attends Banker Meetings: Never    Marital Status:  Married  Intimate Partner Violence: Not At Risk (08/05/2019)   Humiliation, Afraid, Rape, and Kick questionnaire    Fear of Current or Ex-Partner: No    Emotionally Abused: No    Physically Abused: No    Sexually Abused: No    Family History  Problem Relation Age of Onset   Diabetes Mother    Alcohol abuse Mother    Hypertension Father    Lung disease Father    Cancer Father        lung   Breast cancer Sister    Diabetes Sister    Diabetes Sister    Thyroid  disease Sister    Thyroid  disease Sister    Healthy Daughter    Diabetes Brother    Diabetes Other    Stroke Other     Allergies  Allergen Reactions   Oxycodone-Acetaminophen Hives   Sulfa Antibiotics Hives    Review of Systems  All  other systems reviewed and are negative.      Objective:   BP 126/84   Pulse 66   Ht 6' (1.829 m)   Wt 217 lb 12.8 oz (98.8 kg)   SpO2 96%   BMI 29.54 kg/m   Vitals:   09/19/23 1011  BP: 126/84  Pulse: 66  Height: 6' (1.829 m)  Weight: 217 lb 12.8 oz (98.8 kg)  SpO2: 96%  BMI (Calculated): 29.53    Physical Exam Vitals and nursing note reviewed.  Constitutional:      Appearance: Normal appearance. She is normal weight.  HENT:     Head: Normocephalic.  Eyes:     Extraocular Movements: Extraocular movements intact.     Conjunctiva/sclera: Conjunctivae normal.     Pupils: Pupils are equal, round, and reactive to light.  Cardiovascular:     Rate and Rhythm: Normal rate.  Pulmonary:     Effort: Pulmonary effort is normal.  Neurological:     General: No focal deficit present.     Mental Status: She is alert and oriented to person, place, and time. Mental status is at baseline.  Psychiatric:        Mood and Affect: Mood normal.        Behavior: Behavior normal.        Thought Content: Thought content normal.        Judgment: Judgment normal.      No results found for any visits on 09/19/23.  No results found for this or any previous visit (from the past 2160 hours).     Assessment & Plan Essential hypertension Blood pressure well controlled with current medications.  Continue current therapy.  Will reassess at follow up.   - CBC w/Diff - CMP w/eGFR  Lymphedema Patient stable.  Well controlled with current therapy.   Continue current meds.   Prediabetes A1C Continues to be in prediabetic ranges.  Will reassess at follow up after next lab check.  Patient counseled on dietary choices and verbalized understanding.   -CBC w/Diff -CMP w/eGFR -Hemoglobin A1C  Vitamin D  deficiency, unspecified B12 deficiency due to diet Other fatigue Checking labs today.  Will continue supplements as needed.   - Vitamin D  - Vitamin B12 - TSH  Mixed  hyperlipidemia Checking labs today.  Continue current therapy for lipid control. Will modify as needed based on labwork results.   -CMP w/eGFR -Lipid Panel    Return in about 4 months (around 01/19/2024) for F/U.   Total time spent: 20 minutes  Annaya Bangert CHRISTELLA ARRANT, FNP  09/19/2023   This document may have been prepared by Dragon Voice Recognition software and as such may include unintentional dictation errors.

## 2023-09-20 LAB — CBC WITH DIFFERENTIAL/PLATELET
Basophils Absolute: 0 10*3/uL (ref 0.0–0.2)
Basos: 0 %
EOS (ABSOLUTE): 0.2 10*3/uL (ref 0.0–0.4)
Eos: 5 %
Hematocrit: 39.2 % (ref 34.0–46.6)
Hemoglobin: 12.6 g/dL (ref 11.1–15.9)
Immature Grans (Abs): 0 10*3/uL (ref 0.0–0.1)
Immature Granulocytes: 0 %
Lymphocytes Absolute: 1.6 10*3/uL (ref 0.7–3.1)
Lymphs: 34 %
MCH: 29.1 pg (ref 26.6–33.0)
MCHC: 32.1 g/dL (ref 31.5–35.7)
MCV: 91 fL (ref 79–97)
Monocytes Absolute: 0.5 10*3/uL (ref 0.1–0.9)
Monocytes: 10 %
Neutrophils Absolute: 2.3 10*3/uL (ref 1.4–7.0)
Neutrophils: 51 %
Platelets: 227 10*3/uL (ref 150–450)
RBC: 4.33 x10E6/uL (ref 3.77–5.28)
RDW: 13 % (ref 11.7–15.4)
WBC: 4.6 10*3/uL (ref 3.4–10.8)

## 2023-09-20 LAB — CMP14+EGFR
ALT: 12 IU/L (ref 0–32)
AST: 14 IU/L (ref 0–40)
Albumin: 4.1 g/dL (ref 3.8–4.8)
Alkaline Phosphatase: 167 IU/L — ABNORMAL HIGH (ref 44–121)
BUN/Creatinine Ratio: 13 (ref 12–28)
BUN: 12 mg/dL (ref 8–27)
Bilirubin Total: 0.4 mg/dL (ref 0.0–1.2)
CO2: 22 mmol/L (ref 20–29)
Calcium: 9.9 mg/dL (ref 8.7–10.3)
Chloride: 101 mmol/L (ref 96–106)
Creatinine, Ser: 0.9 mg/dL (ref 0.57–1.00)
Globulin, Total: 3.1 g/dL (ref 1.5–4.5)
Glucose: 85 mg/dL (ref 70–99)
Potassium: 4 mmol/L (ref 3.5–5.2)
Sodium: 138 mmol/L (ref 134–144)
Total Protein: 7.2 g/dL (ref 6.0–8.5)
eGFR: 68 mL/min/{1.73_m2} (ref >59)

## 2023-09-20 LAB — LIPID PANEL
Chol/HDL Ratio: 2.4 ratio (ref 0.0–4.4)
Cholesterol, Total: 171 mg/dL (ref 100–199)
HDL: 72 mg/dL (ref >39)
LDL Chol Calc (NIH): 84 mg/dL (ref 0–99)
Triglycerides: 83 mg/dL (ref 0–149)
VLDL Cholesterol Cal: 15 mg/dL (ref 5–40)

## 2023-09-20 LAB — VITAMIN B12: Vitamin B-12: 343 pg/mL (ref 232–1245)

## 2023-09-20 LAB — VITAMIN D 25 HYDROXY (VIT D DEFICIENCY, FRACTURES): Vit D, 25-Hydroxy: 12.8 ng/mL — ABNORMAL LOW (ref 30.0–100.0)

## 2023-09-20 LAB — HEMOGLOBIN A1C
Est. average glucose Bld gHb Est-mCnc: 126 mg/dL
Hgb A1c MFr Bld: 6 % — ABNORMAL HIGH (ref 4.8–5.6)

## 2023-09-20 LAB — TSH: TSH: 1.66 u[IU]/mL (ref 0.450–4.500)

## 2023-09-26 ENCOUNTER — Other Ambulatory Visit: Payer: Self-pay

## 2023-09-26 MED ORDER — VITAMIN D (ERGOCALCIFEROL) 1.25 MG (50000 UNIT) PO CAPS: 50000.0000 [IU] | ORAL_CAPSULE | ORAL | 3 refills | Status: AC

## 2023-10-14 ENCOUNTER — Ambulatory Visit
Admission: RE | Admit: 2023-10-14 | Discharge: 2023-10-14 | Disposition: A | Discharge status: Home / Self Care | Source: Ambulatory Visit | Attending: Family | Admitting: Family

## 2023-10-14 DIAGNOSIS — Z1231 Encounter for screening mammogram for malignant neoplasm of breast: Secondary | ICD-10-CM | POA: Insufficient documentation

## 2023-10-17 ENCOUNTER — Other Ambulatory Visit: Payer: Self-pay | Admitting: Family

## 2023-10-17 DIAGNOSIS — R928 Other abnormal and inconclusive findings on diagnostic imaging of breast: Secondary | ICD-10-CM

## 2023-10-22 ENCOUNTER — Ambulatory Visit
Admission: RE | Admit: 2023-10-22 | Discharge: 2023-10-22 | Disposition: A | Discharge status: Home / Self Care | Source: Ambulatory Visit | Attending: Family | Admitting: Family

## 2023-10-22 ENCOUNTER — Inpatient Hospital Stay
Admission: RE | Admit: 2023-10-22 | Discharge: 2023-10-22 | Discharge status: Home / Self Care | Source: Ambulatory Visit | Attending: Family | Admitting: Family

## 2023-10-22 DIAGNOSIS — R928 Other abnormal and inconclusive findings on diagnostic imaging of breast: Secondary | ICD-10-CM | POA: Diagnosis present

## 2023-10-25 ENCOUNTER — Other Ambulatory Visit: Payer: Self-pay | Admitting: Family

## 2023-10-25 DIAGNOSIS — I10 Essential (primary) hypertension: Secondary | ICD-10-CM

## 2023-11-19 NOTE — Assessment & Plan Note (Signed)
 Blood pressure well controlled with current medications.  Continue current therapy.  Will reassess at follow up.   - CBC w/Diff - CMP w/eGFR

## 2023-11-19 NOTE — Assessment & Plan Note (Signed)
 A1C Continues to be in prediabetic ranges.  Will reassess at follow up after next lab check.  Patient counseled on dietary choices and verbalized understanding.   -CBC w/Diff -CMP w/eGFR -Hemoglobin A1C

## 2023-11-19 NOTE — Assessment & Plan Note (Signed)
 Patient stable.  Well controlled with current therapy.   Continue current meds.

## 2024-01-20 ENCOUNTER — Ambulatory Visit: Admitting: Family

## 2024-02-10 ENCOUNTER — Ambulatory Visit: Admitting: Family

## 2024-02-10 ENCOUNTER — Encounter: Payer: Self-pay | Admitting: Family

## 2024-02-10 DIAGNOSIS — E559 Vitamin D deficiency, unspecified: Secondary | ICD-10-CM

## 2024-02-10 DIAGNOSIS — E782 Mixed hyperlipidemia: Secondary | ICD-10-CM | POA: Diagnosis not present

## 2024-02-10 DIAGNOSIS — I10 Essential (primary) hypertension: Secondary | ICD-10-CM

## 2024-02-10 DIAGNOSIS — R7303 Prediabetes: Secondary | ICD-10-CM

## 2024-02-10 DIAGNOSIS — E538 Deficiency of other specified B group vitamins: Secondary | ICD-10-CM

## 2024-02-10 DIAGNOSIS — I890 Lymphedema, not elsewhere classified: Secondary | ICD-10-CM | POA: Diagnosis not present

## 2024-02-10 DIAGNOSIS — R5383 Other fatigue: Secondary | ICD-10-CM

## 2024-02-10 NOTE — Progress Notes (Signed)
 Established Patient Office Visit  Subjective:  Patient ID: Darlene Alvarez, female    DOB: 1950/06/27  Age: 73 y.o. MRN: [id number]  Chief Complaint  Patient presents with   Follow-up    4 month follow up    Patient is here today for her 4 months follow up.  She has been feeling fairly well since last appointment.   She does have additional concerns to discuss today.   - Reports new onset of right hand pain that started yesterday evening and continued this morning. - Believes hand pain is related to an incident yesterday where she had to force open a heavy door at her office. Also carried heavy bags after shopping. Later, she baked three cakes over the weekend, which involved using her hands. - Reports a history of bursitis, occurring every 4-5 years. 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 - The hand pain is affecting her ability to perform daily activities like baking.  Labs are due today.  She needs refills.   I have reviewed her active problem list, medication list, allergies, health maintenance, notes from last encounter, lab results for her appointment today.      No other concerns at this time.   Past Medical History:  Diagnosis Date   Acute cholecystitis 06/09/2023   Allergy    Class 1 obesity due to excess calories with serious comorbidity and body mass index (BMI) of 31.0 to 31.9 in adult 09/15/2018   Hypertension     Past Surgical History:  Procedure Laterality Date   ABDOMINAL HYSTERECTOMY     BREAST BIOPSY Left 10/01/2018   Affirm Biopsy- X-clip, FIBROADENOMATOUS CHANGES    CESAREAN SECTION     CHOLECYSTECTOMY Bilateral 06/2023    Social History   Socioeconomic History   Marital status: Married    Spouse name: Not on file   Number of children: 1   Years of education: Not on file   Highest education level: Associate degree: occupational, scientist, product/process development, or vocational program  Occupational History   Occupation: social service     Comment: Programmer, [employer].  Tobacco Use   Smoking status: Never   Smokeless tobacco: Never  Vaping Use   Vaping status: Never Used  Substance and Sexual Activity   Alcohol use: No   Drug use: No   Sexual activity: Not on file  Other Topics Concern   Not on file  Social History Narrative   Not on file   Social Drivers of Health   Financial Resource Strain: Low Risk  (08/05/2019)   Overall Financial Resource Strain (CARDIA)    Difficulty of Paying Living Expenses: Not hard at all  Food Insecurity: No Food Insecurity (08/05/2019)   Hunger Vital Sign    Worried About Running Out of Food in the Last Year: Never true    Ran Out of Food in the Last Year: Never true  Transportation Needs: No Transportation Needs (08/05/2019)   PRAPARE - Administrator, Civil Service (Medical): No    Lack of Transportation (Non-Medical): No  Physical Activity: Inactive (08/05/2019)   Exercise Vital Sign    Days of Exercise per Week: 0 days    Minutes of Exercise per Session: 0 min  Stress: No Stress Concern Present (08/05/2019)   [employer] of Occupational Health - Occupational Stress Questionnaire    Feeling of Stress : Not at all  Social Connections: Moderately Integrated (08/05/2019)   Social Connection and Isolation Panel    Frequency  of Communication with Friends and Family: More than three times a week    Frequency of Social Gatherings with Friends and Family: More than three times a week    Attends Religious Services: More than 4 times per year    Active Member of [employer] or Organizations: No    Attends Banker Meetings: Never    Marital Status: Married  Catering Manager Violence: Not At Risk (08/05/2019)   Humiliation, Afraid, Rape, and Kick questionnaire    Fear of Current or Ex-Partner: No    Emotionally Abused: No    Physically Abused: No    Sexually Abused: No    Family History  Problem Relation Age of Onset   Diabetes Mother    Alcohol abuse Mother     Hypertension Father    Lung disease Father    Cancer Father        lung   Breast cancer Sister    Diabetes Sister    Diabetes Sister    Thyroid  disease Sister    Thyroid  disease Sister    Healthy Daughter    Diabetes Brother    Diabetes Other    Stroke Other     Allergies  Allergen Reactions   Fluzone [Influenza Vac Split Quad] Swelling   Oxycodone-Acetaminophen Hives   Sulfa Antibiotics Hives    Review of Systems  Musculoskeletal:  Positive for joint pain.  All other systems reviewed and are negative.      Objective:   BP (!) 144/90   Pulse 93   Ht 6' (1.829 m)   Wt 219 lb 12.8 oz (99.7 kg)   SpO2 98%   BMI 29.81 kg/m   Vitals:   02/10/24 1009  BP: (!) 144/90  Pulse: 93  Height: 6' (1.829 m)  Weight: 219 lb 12.8 oz (99.7 kg)  SpO2: 98%  BMI (Calculated): 29.8    Physical Exam Vitals and nursing note reviewed.  Constitutional:      Appearance: Normal appearance. She is normal weight.  HENT:     Head: Normocephalic.  Eyes:     Extraocular Movements: Extraocular movements intact.     Conjunctiva/sclera: Conjunctivae normal.     Pupils: Pupils are equal, round, and reactive to light.  Cardiovascular:     Rate and Rhythm: Normal rate.     Pulses: Normal pulses.  Pulmonary:     Effort: Pulmonary effort is normal.     Breath sounds: Normal breath sounds.  Musculoskeletal:        General: Normal range of motion.  Neurological:     General: No focal deficit present.     Mental Status: She is alert and oriented to person, place, and time. Mental status is at baseline.  Psychiatric:        Mood and Affect: Mood normal.        Behavior: Behavior normal.        Thought Content: Thought content normal.      No results found for any visits on 02/10/24.  No results found for this or any previous visit (from the past 2160 hours).     Assessment & Plan Essential hypertension Blood pressure well controlled with current medications.  Continue  current therapy.  Will reassess at follow up.   - CBC w/Diff - CMP w/eGFR  Lymphedema Patient stable.  Well controlled with current therapy.   Continue current treatment.   Prediabetes A1C Continues to be in prediabetic ranges.  Will reassess at follow up after next  lab check.  Patient counseled on dietary choices and verbalized understanding.   -CBC w/Diff -CMP w/eGFR -Hemoglobin A1C  Vitamin D  deficiency, unspecified B12 deficiency due to diet Other fatigue Checking labs today.  Will continue supplements as needed.   - Vitamin D  - Vitamin B12 - TSH  Mixed hyperlipidemia Checking labs today.  Continue current therapy for lipid control. Will modify as needed based on labwork results.   -CMP w/eGFR -Lipid Panel     Return in about 4 months (around 06/09/2024).   Total time spent: 20 minutes  ALAN CHRISTELLA ARRANT, FNP  02/10/2024   This document may have been prepared by [facility] Voice Recognition software and as such may include unintentional dictation errors.

## 2024-02-10 NOTE — Assessment & Plan Note (Signed)
 Blood pressure well controlled with current medications.  Continue current therapy.  Will reassess at follow up.   - CBC w/Diff - CMP w/eGFR

## 2024-02-10 NOTE — Assessment & Plan Note (Signed)
.  A1C Continues to be in prediabetic ranges.  Will reassess at follow up after next lab check.  Patient counseled on dietary choices and verbalized understanding.   -CBC w/Diff -CMP w/eGFR -Hemoglobin A1C

## 2024-02-10 NOTE — Assessment & Plan Note (Signed)
 Patient stable.  Well controlled with current therapy.   Continue current treatment.

## 2024-02-11 LAB — CBC WITH DIFFERENTIAL/PLATELET
Basophils Absolute: 0 x10E3/uL (ref 0.0–0.2)
Basos: 1 %
EOS (ABSOLUTE): 0.3 x10E3/uL (ref 0.0–0.4)
Eos: 6 %
Hematocrit: 36.9 % (ref 34.0–46.6)
Hemoglobin: 11.7 g/dL (ref 11.1–15.9)
Immature Grans (Abs): 0 x10E3/uL (ref 0.0–0.1)
Immature Granulocytes: 0 %
Lymphocytes Absolute: 1.7 x10E3/uL (ref 0.7–3.1)
Lymphs: 38 %
MCH: 28.3 pg (ref 26.6–33.0)
MCHC: 31.7 g/dL (ref 31.5–35.7)
MCV: 89 fL (ref 79–97)
Monocytes Absolute: 0.4 x10E3/uL (ref 0.1–0.9)
Monocytes: 9 %
Neutrophils Absolute: 2.1 x10E3/uL (ref 1.4–7.0)
Neutrophils: 46 %
Platelets: 234 x10E3/uL (ref 150–450)
RBC: 4.14 x10E6/uL (ref 3.77–5.28)
RDW: 12.8 % (ref 11.7–15.4)
WBC: 4.4 x10E3/uL (ref 3.4–10.8)

## 2024-02-11 LAB — CMP14+EGFR
ALT: 11 IU/L (ref 0–32)
AST: 14 IU/L (ref 0–40)
Albumin: 4 g/dL (ref 3.8–4.8)
Alkaline Phosphatase: 149 IU/L — ABNORMAL HIGH (ref 49–135)
BUN/Creatinine Ratio: 10 — ABNORMAL LOW (ref 12–28)
BUN: 10 mg/dL (ref 8–27)
Bilirubin Total: 0.5 mg/dL (ref 0.0–1.2)
CO2: 23 mmol/L (ref 20–29)
Calcium: 9.1 mg/dL (ref 8.7–10.3)
Chloride: 102 mmol/L (ref 96–106)
Creatinine, Ser: 0.98 mg/dL (ref 0.57–1.00)
Globulin, Total: 3.2 g/dL (ref 1.5–4.5)
Glucose: 87 mg/dL (ref 70–99)
Potassium: 3.7 mmol/L (ref 3.5–5.2)
Sodium: 138 mmol/L (ref 134–144)
Total Protein: 7.2 g/dL (ref 6.0–8.5)
eGFR: 62 mL/min/1.73 (ref >59)

## 2024-02-11 LAB — TSH: TSH: 1.23 u[IU]/mL (ref 0.450–4.500)

## 2024-02-11 LAB — LIPID PANEL
Chol/HDL Ratio: 2.6 ratio (ref 0.0–4.4)
Cholesterol, Total: 169 mg/dL (ref 100–199)
HDL: 66 mg/dL (ref >39)
LDL Chol Calc (NIH): 90 mg/dL (ref 0–99)
Triglycerides: 70 mg/dL (ref 0–149)
VLDL Cholesterol Cal: 13 mg/dL (ref 5–40)

## 2024-02-11 LAB — HEMOGLOBIN A1C
Est. average glucose Bld gHb Est-mCnc: 114 mg/dL
Hgb A1c MFr Bld: 5.6 % (ref 4.8–5.6)

## 2024-02-11 LAB — VITAMIN D 25 HYDROXY (VIT D DEFICIENCY, FRACTURES): Vit D, 25-Hydroxy: 39.7 ng/mL (ref 30.0–100.0)

## 2024-02-11 LAB — VITAMIN B12: Vitamin B-12: 333 pg/mL (ref 232–1245)

## 2024-03-27 ENCOUNTER — Other Ambulatory Visit: Payer: Self-pay

## 2024-06-15 ENCOUNTER — Ambulatory Visit: Admitting: Family
